# Patient Record
Sex: Female | Born: 1965 | Race: White | Hispanic: No | Marital: Married | Smoking: Never smoker
Health system: Southern US, Community
[De-identification: ages and names within clinical notes are randomized; demographics above are authoritative.]

## PROBLEM LIST (undated history)

## (undated) DIAGNOSIS — L409 Psoriasis, unspecified: Secondary | ICD-10-CM

## (undated) HISTORY — PX: WISDOM TOOTH EXTRACTION: SHX21

## (undated) HISTORY — PX: APPENDECTOMY: SHX54

## (undated) HISTORY — DX: Psoriasis, unspecified: L40.9

## (undated) HISTORY — PX: ADENOIDECTOMY: SHX5191

---

## 1999-10-27 ENCOUNTER — Other Ambulatory Visit: Admission: RE | Admit: 1999-10-27 | Discharge: 1999-10-27 | Payer: Self-pay | Admitting: Obstetrics and Gynecology

## 1999-11-14 LAB — HM DEXA SCAN

## 2000-12-14 ENCOUNTER — Other Ambulatory Visit: Admission: RE | Admit: 2000-12-14 | Discharge: 2000-12-14 | Payer: Self-pay | Admitting: Obstetrics and Gynecology

## 2002-02-24 ENCOUNTER — Other Ambulatory Visit: Admission: RE | Admit: 2002-02-24 | Discharge: 2002-02-24 | Payer: Self-pay | Admitting: Obstetrics and Gynecology

## 2003-04-26 ENCOUNTER — Other Ambulatory Visit: Admission: RE | Admit: 2003-04-26 | Discharge: 2003-04-26 | Payer: Self-pay | Admitting: Obstetrics and Gynecology

## 2004-01-07 ENCOUNTER — Emergency Department (HOSPITAL_COMMUNITY): Admission: EM | Admit: 2004-01-07 | Discharge: 2004-01-07 | Payer: Self-pay | Admitting: Emergency Medicine

## 2004-01-10 ENCOUNTER — Encounter (HOSPITAL_COMMUNITY): Admission: RE | Admit: 2004-01-10 | Discharge: 2004-02-04 | Payer: Self-pay | Admitting: Emergency Medicine

## 2004-07-14 ENCOUNTER — Other Ambulatory Visit: Admission: RE | Admit: 2004-07-14 | Discharge: 2004-07-14 | Payer: Self-pay | Admitting: Obstetrics and Gynecology

## 2005-03-21 ENCOUNTER — Inpatient Hospital Stay (HOSPITAL_COMMUNITY): Admission: AD | Admit: 2005-03-21 | Discharge: 2005-03-21 | Payer: Self-pay | Admitting: Obstetrics and Gynecology

## 2005-03-21 ENCOUNTER — Inpatient Hospital Stay (HOSPITAL_COMMUNITY): Admission: AD | Admit: 2005-03-21 | Discharge: 2005-03-23 | Payer: Self-pay | Admitting: Obstetrics and Gynecology

## 2005-03-24 ENCOUNTER — Encounter: Admission: RE | Admit: 2005-03-24 | Discharge: 2005-04-22 | Payer: Self-pay | Admitting: Obstetrics and Gynecology

## 2005-04-23 ENCOUNTER — Encounter: Admission: RE | Admit: 2005-04-23 | Discharge: 2005-05-23 | Payer: Self-pay | Admitting: Obstetrics and Gynecology

## 2005-04-29 ENCOUNTER — Other Ambulatory Visit: Admission: RE | Admit: 2005-04-29 | Discharge: 2005-04-29 | Payer: Self-pay | Admitting: Obstetrics and Gynecology

## 2005-05-24 ENCOUNTER — Encounter: Admission: RE | Admit: 2005-05-24 | Discharge: 2005-06-23 | Payer: Self-pay | Admitting: Obstetrics and Gynecology

## 2005-06-24 ENCOUNTER — Encounter: Admission: RE | Admit: 2005-06-24 | Discharge: 2005-07-21 | Payer: Self-pay | Admitting: Obstetrics and Gynecology

## 2005-07-22 ENCOUNTER — Encounter: Admission: RE | Admit: 2005-07-22 | Discharge: 2005-08-21 | Payer: Self-pay | Admitting: Obstetrics and Gynecology

## 2005-08-22 ENCOUNTER — Encounter: Admission: RE | Admit: 2005-08-22 | Discharge: 2005-09-20 | Payer: Self-pay | Admitting: Obstetrics and Gynecology

## 2005-09-21 ENCOUNTER — Encounter: Admission: RE | Admit: 2005-09-21 | Discharge: 2005-10-21 | Payer: Self-pay | Admitting: Obstetrics and Gynecology

## 2005-10-22 ENCOUNTER — Encounter: Admission: RE | Admit: 2005-10-22 | Discharge: 2005-11-20 | Payer: Self-pay | Admitting: Obstetrics and Gynecology

## 2005-11-21 ENCOUNTER — Encounter: Admission: RE | Admit: 2005-11-21 | Discharge: 2005-12-21 | Payer: Self-pay | Admitting: Obstetrics and Gynecology

## 2005-12-22 ENCOUNTER — Encounter: Admission: RE | Admit: 2005-12-22 | Discharge: 2006-01-21 | Payer: Self-pay | Admitting: Obstetrics and Gynecology

## 2006-01-22 ENCOUNTER — Encounter: Admission: RE | Admit: 2006-01-22 | Discharge: 2006-02-20 | Payer: Self-pay | Admitting: Obstetrics and Gynecology

## 2006-02-21 ENCOUNTER — Encounter: Admission: RE | Admit: 2006-02-21 | Discharge: 2006-03-23 | Payer: Self-pay | Admitting: Obstetrics and Gynecology

## 2006-03-24 ENCOUNTER — Encounter: Admission: RE | Admit: 2006-03-24 | Discharge: 2006-04-23 | Payer: Self-pay | Admitting: Obstetrics and Gynecology

## 2009-07-29 ENCOUNTER — Encounter: Admission: RE | Admit: 2009-07-29 | Discharge: 2009-07-29 | Payer: Self-pay | Admitting: Obstetrics and Gynecology

## 2009-12-12 ENCOUNTER — Encounter: Admission: RE | Admit: 2009-12-12 | Discharge: 2009-12-12 | Payer: Self-pay | Admitting: Obstetrics and Gynecology

## 2009-12-18 ENCOUNTER — Encounter: Admission: RE | Admit: 2009-12-18 | Discharge: 2009-12-18 | Payer: Self-pay | Admitting: Obstetrics and Gynecology

## 2010-06-08 ENCOUNTER — Encounter: Payer: Self-pay | Admitting: Obstetrics and Gynecology

## 2010-10-03 NOTE — Op Note (Signed)
NAME:  Martha Rhodes, Martha Rhodes               ACCOUNT NO.:  [id number]   MEDICAL RECORD NO.:  [id number]          PATIENT TYPE:  INP   LOCATION:  9167                          FACILITY:  [facility]   PHYSICIAN:  Juluis Mire, M.D.   DATE OF BIRTH:  August 20, 1965   DATE OF PROCEDURE:  03/21/2005  DATE OF DISCHARGE:                                 OPERATIVE REPORT   DELIVERY NOTE:  The patient presented with spontaneous onset of labor at 37-1/2 weeks.  With  the onset of second stage, infant started having repetitive deep variables.  Initially responded to some position changes, hydration and ephedrine.  However, as the vertex descended, the variables became more persistent and  deep.  The patient was set up in the dorsal lithotomy position and the  decision to proceed with a vacuum-assisted vaginal delivery.  The risks were  discussed including possibility of hematoma formation with need for  transfusion.  Rare risk of intracranial bleeds.  The Kiwi VE was put in  place.  The infant was in the OA presentation.  With the next contraction,  the vertex was delivered with the aid of the vacuum extractor with an easy  pull.  The infant was a viable female, Apgars 8 and 9.  PH pending.  There was  no episiotomy.  She had a left periurethral tear that was repaired and a  second-degree midline tear that was repaired.  The placenta was delivered  intact with three-vessel cord.  Estimated blood loss was 400 mL.  Mother and  baby were doing well in the postpartum period.      Juluis Mire, M.D.  Electronically Signed     JSM/MEDQ  D:  03/21/2005  T:  03/22/2005  Job:  [id number]

## 2014-11-09 ENCOUNTER — Encounter: Payer: Self-pay | Admitting: Obstetrics and Gynecology

## 2015-06-10 ENCOUNTER — Encounter: Payer: Self-pay | Admitting: Gastroenterology

## 2015-06-19 ENCOUNTER — Ambulatory Visit: Payer: BC Managed Care – PPO | Attending: Obstetrics and Gynecology

## 2015-06-19 DIAGNOSIS — M545 Low back pain, unspecified: Secondary | ICD-10-CM

## 2015-06-19 DIAGNOSIS — M25659 Stiffness of unspecified hip, not elsewhere classified: Secondary | ICD-10-CM | POA: Diagnosis present

## 2015-06-19 NOTE — Patient Instructions (Signed)
All stretches 20 second hold, 2-3 reps, 2-3 times a day   Piriformis Stretch - Supine    Pull uninvolved knee across body toward opposite shoulder. Hold slight stretch for ___ seconds. Repeat with involved leg. Repeat ___ times. Do ___ times per day.  Piriformis Stretch, Sitting    Sit, one ankle on opposite knee, same-side hand on crossed knee. Push down on knee, keeping spine straight. Lean torso forward, with flat back, until tension is felt in hamstrings and gluteals of crossed-leg side. Hold ___ seconds.  Repeat ___ times per session. Do ___ sessions per day.  Copyright  VHI. All rights reserved.  HIP: Hamstrings - Short Sitting    Rest leg on raised surface. Keep knee straight. Lift chest. Hold ___ seconds. ___ reps per set, ___ sets per day, ___ days per week  Copyright  VHI. All rights reserved.  Piriformis Stretch, Supine    Lie supine, folded towel under sacrum, one ankle crossed onto opposite knee. Holding bottom leg behind knee, gently pull legs toward chest and roll toward top-leg side. Feel stretch in hip or pelvic region. Hold ___ seconds.  Repeat ___ times per session. Do ___ sessions per day.  Copyright  VHI. All rights reserved.  [facility] [address], [address] [place], [place] [address] Phone # [phone] Fax [phone]

## 2015-06-19 NOTE — Therapy (Signed)
[facility] [address] [place], [place], [address] Phone: [phone]   Fax:  [phone]  Physical Therapy Evaluation  Patient Details  Name: Martha Rhodes MRN: [id number] Date of Birth: 1965-12-28 Referring Provider: Marcelle Overlie, MD  Encounter Date: 06/19/2015      PT End of Session - 06/19/15 1140    Visit Number 1   Date for PT Re-Evaluation 08/14/15   PT Start Time 1100   PT Stop Time 1142   PT Time Calculation (min) 42 min   Activity Tolerance Patient tolerated treatment well   Behavior During Therapy [facility] for tasks assessed/performed      History reviewed. No pertinent past medical history.  History reviewed. No pertinent past surgical history.  There were no vitals filed for this visit.  Visit Diagnosis:  Right-sided low back pain without sciatica - Plan: PT plan of care cert/re-cert  Hip stiffness, unspecified laterality - Plan: PT plan of care cert/re-cert      Subjective Assessment - 06/19/15 1105    Subjective Pt reports to PT with chronic history (3-4 years) of LBP/SI joint pain and Rt leg pain.  Pt reports that she was being treated at another facility that was out of network.  Treatment included core strength and felxiblity.  Pt has had to stop running due to "tripping" with this activity.     Pertinent History no injury   Limitations Sitting   How long can you sit comfortably? sits a lot with work- pain (5/10)   Diagnostic tests none   Patient Stated Goals improve core strength, reduce pain, not trip while walking   Currently in Pain? Yes   Pain Score 4    Pain Location Back   Pain Orientation Right;Lower   Pain Descriptors / Indicators Sore;Aching   Pain Type Chronic pain   Pain Radiating Towards Rt hip   Pain Onset More than a month ago   Pain Frequency Constant   Aggravating Factors  sitting for long periods, lifting   Pain Relieving Factors stretching, Advil at night,  pillow between the knees   Effect of Pain on Daily Activities limited ability to lift, too much pain to run            [facility] PT Assessment - 06/19/15 0001    Assessment   Medical Diagnosis back pain (M54.5), leg pain (R52), leg misalignment (M21.769)   Referring Provider Marcelle Overlie, MD   Onset Date/Surgical Date 06/18/12   Next MD Visit none   Prior Therapy PT in [place]: mainly massage and HEP for core strength and flexibility.    Precautions   Precautions None   Restrictions   Weight Bearing Restrictions No   Balance Screen   Has the patient fallen in the past 6 months No   Has the patient had a decrease in activity level because of a fear of falling?  No   Is the patient reluctant to leave their home because of a fear of falling?  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   Leisure exercise   Cognition   Overall Cognitive Status Within Functional Limits for tasks assessed   Observation/Other Assessments   Focus on Therapeutic Outcomes (FOTO)  48% limitation   Posture/Postural Control   Posture/Postural Control Postural limitations   Postural  Limitations Decreased lumbar lordosis  Rt leg ~1/2 inch longer-pt wears heel lift in the Lt shoe   ROM / Strength   AROM / PROM / Strength AROM;PROM;Strength   AROM   Overall AROM  Deficits   Overall AROM Comments lumbar AROM is WFLs with reduced segmental mobility in the lumbar spine with all lumbar AROM   PROM   Overall PROM  Deficits   Overall PROM Comments Lt hip hamstring limited by 25% vs the Rt, hip IR limited by 25% bilaterally   Strength   Overall Strength Within functional limits for tasks performed   Overall Strength Comments 5/5 bilateral LE strength except hip abduction 4+/5   Palpation   Spinal mobility reduced PA mobility in the lumbar  spine by 25% without pain   Palpation comment tension noted in Rt and Lt deep gluteals and lumbar paraspinals   Special Tests    Special Tests Lumbar   Lumbar Tests Slump Test;Straight Leg Raise   Slump test   Findings Negative   Side Right   Straight Leg Raise   Findings Negative   Side  Right   Ambulation/Gait   Ambulation/Gait Yes   Ambulation/Gait Assistance 7: Independent                           PT Education - 06/19/15 1138    Education provided Yes   Education Details HEP: hip and hamstring flexibility   Person(s) Educated Patient   Methods Explanation;Demonstration;Handout   Comprehension Verbalized understanding;Returned demonstration          PT Short Term Goals - 06/19/15 1145    PT SHORT TERM GOAL #1   Title be independent in iniital HEP   Time 4   Period Weeks   Status New   PT SHORT TERM GOAL #2   Title report a 30% reduction in LBP/Rt hip pain with sitting at work   Time 4   Period Weeks   Status New   PT SHORT TERM GOAL #3   Title verbalize and demonstrate correct body mechanics for lumbar protection and report use of mini breaks at work   Time 4   Period Weeks   Status New           PT Long Term Goals - 06/19/15 1100    PT LONG TERM GOAL #1   Title be independent in advanced HEP   Time 8   Period Weeks   Status New   PT LONG TERM GOAL #2   Title reduce FOTO to < or = to 36% limitation   Time 8   Period Weeks   Status New   PT LONG TERM GOAL #3   Title report a 60% reduction in LBP and Rt hip pain with sitting at work   Time 8   Period Weeks   Status New               Plan - 06/19/15 1142    Clinical Impression Statement Pt presents to PT with chronic history of LBP and Rt SI joint pain of a chronic nature.  Pt demonstrates hip stiffness Lt>Rt, Rt LE is longer, and pain with sitting long periods.  Pt with reduced lumbar segmental mobility in the lumbar spine.  Pt will benefit from PT for body mechanics  education, advancement of core stab/pilates exercises and flexibility of hips and lumbar spine.     Pt will benefit from skilled therapeutic intervention in  order to improve on the following deficits Decreased mobility;Impaired flexibility;Pain;Decreased activity tolerance;Hypomobility   Rehab Potential Good   PT Frequency 1x / week   PT Duration 8 weeks   PT Treatment/Interventions ADLs/Self Care Home Management;Cryotherapy;Electrical Stimulation;Moist Heat;Therapeutic exercise;Therapeutic activities;Functional mobility training;Ultrasound;Neuromuscular re-education;Manual techniques;Taping;Passive range of motion   PT Next Visit Plan core exercises, body mechanics, hip flexibility Lt>Rt, pain management as needed.   Consulted and Agree with Plan of Care Patient         Problem List There are no active problems to display for this patient.   Reshunda Strider, PT 06/19/2015, 12:40 PM  [facility] [address], [address] [place], [place], [address] Phone: [phone]   Fax:  [phone]  Name: TZIPORAH KNOKE MRN: [id number] Date of Birth: 10-06-1965

## 2015-07-03 ENCOUNTER — Encounter: Payer: Self-pay | Admitting: Physical Therapy

## 2015-07-03 ENCOUNTER — Ambulatory Visit: Payer: BC Managed Care – PPO | Admitting: Physical Therapy

## 2015-07-03 DIAGNOSIS — M25659 Stiffness of unspecified hip, not elsewhere classified: Secondary | ICD-10-CM

## 2015-07-03 DIAGNOSIS — M545 Low back pain, unspecified: Secondary | ICD-10-CM

## 2015-07-03 NOTE — Therapy (Signed)
[facility] [address], [address] [place], [place], [address] Phone: [phone]   Fax:  [phone]  Physical Therapy Treatment  Patient Details  Name: NOE PITTSLEY MRN: [id number] Date of Birth: 1966/04/26 Referring Provider: Marcelle Overlie, MD  Encounter Date: 07/03/2015      PT End of Session - 07/03/15 1155    Visit Number 2   Date for PT Re-Evaluation 08/14/15   PT Start Time 1148   PT Stop Time 1231   PT Time Calculation (min) 43 min   Activity Tolerance Patient tolerated treatment well   Behavior During Therapy [facility] for tasks assessed/performed      History reviewed. No pertinent past medical history.  History reviewed. No pertinent past surgical history.  There were no vitals filed for this visit.  Visit Diagnosis:  Right-sided low back pain without sciatica  Hip stiffness, unspecified laterality      Subjective Assessment - 07/03/15 1151    Subjective Doing her exercises at home 5 days week. Normal "chronic"  right sided pain today.    Currently in Pain? Yes   Pain Score 6    Pain Location Back   Pain Orientation Right;Lower   Pain Descriptors / Indicators Sore   Aggravating Factors  Sitting too long   Pain Relieving Factors Exercises, meds   Multiple Pain Sites No                         [facility] Adult PT Treatment/Exercise - 07/03/15 0001    Lumbar Exercises: Stretches   Active Hamstring Stretch 3 reps;20 seconds  Seated    Single Knee to Chest Stretch 1 rep;20 seconds   Piriformis Stretch 2 reps;20 seconds   Lumbar Exercises: Aerobic   Stationary Bike Nustep L3 x 10 min   Lumbar Exercises: Supine   Other Supine Lumbar Exercises Lumbar release with soft foam roll.                  PT Short Term Goals - 07/03/15 1216    PT SHORT TERM GOAL #1   Title be independent in iniital HEP   Time 4   Period Weeks   Status Achieved   PT SHORT TERM GOAL #2   Title  report a 30% reduction in LBP/Rt hip pain with sitting at work   Time 4   Period Weeks   Status On-going  Too early   PT SHORT TERM GOAL #3   Title verbalize and demonstrate correct body mechanics for lumbar protection and report use of mini breaks at work   Time 4   Period Weeks   Status Achieved           PT Long Term Goals - 06/19/15 1100    PT LONG TERM GOAL #1   Title be independent in advanced HEP   Time 8   Period Weeks   Status New   PT LONG TERM GOAL #2   Title reduce FOTO to < or = to 36% limitation   Time 8   Period Weeks   Status New   PT LONG TERM GOAL #3   Title report a 60% reduction in LBP and Rt hip pain with sitting at work   Time 8   Period Weeks   Status New               Plan - 07/03/15 1216    Clinical Impression Statement First treatment today. pt  compliant and independent in her HEP and taking short but frequent rest breaks throughout her day. Review of lumbar protective body mechanics today. Introduced release work today for the [employer] using the soft foam roll.    Pt will benefit from skilled therapeutic intervention in order to improve on the following deficits Decreased mobility;Impaired flexibility;Pain;Decreased activity tolerance;Hypomobility   Rehab Potential Good   PT Frequency 1x / week   PT Duration 8 weeks   PT Treatment/Interventions ADLs/Self Care Home Management;Cryotherapy;Electrical Stimulation;Moist Heat;Therapeutic exercise;Therapeutic activities;Functional mobility training;Ultrasound;Neuromuscular re-education;Manual techniques;Taping;Passive range of motion   PT Next Visit Plan See how pt felt with release work.    Consulted and Agree with Plan of Care Patient        Problem List There are no active problems to display for this patient.   Alejos Reinhardt, PTA 07/03/2015, 12:32 PM  [facility] [address] [place], [place], [address] Phone:  [phone]   Fax:  [phone]  Name: POLLIE POMA MRN: [id number] Date of Birth: 1966/01/19

## 2015-07-17 ENCOUNTER — Encounter: Payer: Self-pay | Admitting: Physical Therapy

## 2015-07-17 ENCOUNTER — Ambulatory Visit: Payer: BC Managed Care – PPO | Attending: Obstetrics and Gynecology | Admitting: Physical Therapy

## 2015-07-17 DIAGNOSIS — M545 Low back pain, unspecified: Secondary | ICD-10-CM

## 2015-07-17 DIAGNOSIS — M25659 Stiffness of unspecified hip, not elsewhere classified: Secondary | ICD-10-CM | POA: Insufficient documentation

## 2015-07-17 NOTE — Therapy (Signed)
[facility] [address], [address] [place], [place], [address] Phone: [phone]   Fax:  [phone]  Physical Therapy Treatment  Patient Details  Name: Martha Rhodes MRN: [id number] Date of Birth: Nov 27, 1965 Referring Provider: Marcelle Overlie, MD  Encounter Date: 07/17/2015      PT End of Session - 07/17/15 1107    Visit Number 3   Date for PT Re-Evaluation 08/14/15   PT Start Time 1105   PT Stop Time 1145   PT Time Calculation (min) 40 min   Activity Tolerance Patient tolerated treatment well   Behavior During Therapy [facility] for tasks assessed/performed      History reviewed. No pertinent past medical history.  History reviewed. No pertinent past surgical history.  There were no vitals filed for this visit.  Visit Diagnosis:  Right-sided low back pain without sciatica  Hip stiffness, unspecified laterality      Subjective Assessment - 07/17/15 1106    Subjective The foam roll technique really helped. They have them at the [facility].   Currently in Pain? No/denies   Multiple Pain Sites No                         [facility] Adult PT Treatment/Exercise - 07/17/15 0001    Lumbar Exercises: Aerobic   Stationary Bike Nustep L3 x 10 min                PT Education - 07/17/15 1109    Education provided Yes   Education Details HEP, Release method for lumbar   Person(s) Educated Patient   Methods Explanation;Demonstration;Tactile cues;Verbal cues;Handout   Comprehension Verbalized understanding;Returned demonstration          PT Short Term Goals - 07/17/15 1108    PT SHORT TERM GOAL #2   Title report a 30% reduction in LBP/Rt hip pain with sitting at work   Time 4   Period Weeks   Status On-going           PT Long Term Goals - 06/19/15 1100    PT LONG TERM GOAL #1   Title be independent in advanced HEP   Time 8   Period Weeks   Status New   PT LONG TERM GOAL #2   Title reduce  FOTO to < or = to 36% limitation   Time 8   Period Weeks   Status New   PT LONG TERM GOAL #3   Title report a 60% reduction in LBP and Rt hip pain with sitting at work   Time 8   Period Weeks   Status New               Plan - 07/17/15 1108    Pt will benefit from skilled therapeutic intervention in order to improve on the following deficits Decreased mobility;Impaired flexibility;Pain;Decreased activity tolerance;Hypomobility   Rehab Potential Good   PT Frequency 1x / week   PT Duration 8 weeks   PT Treatment/Interventions ADLs/Self Care Home Management;Cryotherapy;Electrical Stimulation;Moist Heat;Therapeutic exercise;Therapeutic activities;Functional mobility training;Ultrasound;Neuromuscular re-education;Manual techniques;Taping;Passive range of motion   PT Next Visit Plan Review foam roll exs   Consulted and Agree with Plan of Care Patient        Problem List There are no active problems to display for this patient.   Nahsir Venezia, PTA 07/17/2015, 11:42 AM  [facility] [address] [place], [place], [address] Phone: [phone]   Fax:  [phone]  Name: Martha Rhodes MRN: [id number] Date of Birth: 1965/10/14

## 2015-07-17 NOTE — Patient Instructions (Signed)
Soft foram roll release technique for lumbar. Assess first, lay flat and pay attention to what parts of your body touch the floor and which do not.   - Lay on the roll length wise, head to tailbone is ON the roll. Arms by your side, palms up. Breathe in, exhale and bring heaviness to the back of your body. Relax, let go.  Do for 1-2 min  - Scissor arms: alternating reaching the arm overhead with an inhale, exhale switch. Keep shoulder blades grounded on the roll. Do 10x each side-ish....Jamelle Haring angel arms: reach arms up together, overhead, then around back to the side of your body. Breathe! Do 10x ish....  - Slow, small rocking side to side. This should feel like you ALMOST fall off roll but you don't. It's like a massage to your spine. Breathe.    ASSESS: lay flat on floor and note changes to your body.   Change postions on roll. Lay flat with knees bent. Put roll under the bent knees, lift your buttocks up while you pull the roll onto your tailbone.    -Bring the knees up so they are slightly bent towards you, not quite parallel.  - SLOWLY rocking knees side to side. SMALL range of motion, breathe. 6x- 10x each side. - Circles; from the kneecaps, circle small circles 10x direction. Breathe You can do both directions.  - Circle with knees to the side: Staying with knees to the right, perform 6-10 SLOW circles, then repeat on the other side. - Straight leg stretch up in the air: Straighten the right leg up in the air with ankle flexed. Hold for 4 breaths, then circle ankle 5x each way.  Repeat with left leg. Do not force. - Put right foot down on mat and actively press into the mat as you pull the left knee to chest. Hold for 5 breaths. Repeat on other leg.   Then final rest assessment.   -

## 2015-07-19 ENCOUNTER — Ambulatory Visit (AMBULATORY_SURGERY_CENTER): Payer: Self-pay | Admitting: *Deleted

## 2015-07-19 VITALS — Ht 65.0 in | Wt 165.0 lb

## 2015-07-19 DIAGNOSIS — Z1211 Encounter for screening for malignant neoplasm of colon: Secondary | ICD-10-CM

## 2015-07-19 MED ORDER — SUPREP BOWEL PREP KIT 17.5-3.13-1.6 GM/177ML PO SOLN: 1.0000 | Freq: Once | ORAL | Status: DC

## 2015-07-19 NOTE — Progress Notes (Signed)
Patient denies any allergies to egg or soy products. Patient denies complications with anesthesia/sedation.  Patient denies oxygen use at home and denies diet medications. Emmi instructions for colonoscopy but denied.  

## 2015-07-29 ENCOUNTER — Ambulatory Visit: Payer: BC Managed Care – PPO | Admitting: Physical Therapy

## 2015-07-29 ENCOUNTER — Encounter: Payer: Self-pay | Admitting: Physical Therapy

## 2015-07-29 DIAGNOSIS — M25659 Stiffness of unspecified hip, not elsewhere classified: Secondary | ICD-10-CM

## 2015-07-29 DIAGNOSIS — M545 Low back pain, unspecified: Secondary | ICD-10-CM

## 2015-07-29 NOTE — Therapy (Addendum)
[facility] [address], [place], [place], [address] Phone: [phone]   Fax:  [phone]  Physical Therapy Treatment  Patient Details  Name: Martha Rhodes MRN: [id number] Date of Birth: Feb 26, 1966 Referring Provider: Dian Queen, MD  Encounter Date: 07/29/2015      PT End of Session - 07/29/15 1034    Visit Number 4   Date for PT Re-Evaluation 08/14/15   PT Start Time 1025  10 min late   PT Stop Time 1100   PT Time Calculation (min) 35 min   Activity Tolerance Patient tolerated treatment well   Behavior During Therapy [facility] for tasks assessed/performed      Past Medical History  Diagnosis Date  . Psoriasis     Past Surgical History  Procedure Laterality Date  . Appendectomy    . Wisdom tooth extraction      There were no vitals filed for this visit.  Visit Diagnosis:  Right-sided low back pain without sciatica  Hip stiffness, unspecified laterality      Subjective Assessment - 07/29/15 1030    Subjective I got a roll, tried at home, could not get off the floor without help bc of pain and a feeling of not being bale to hold myself up.    Currently in Pain? Yes   Pain Score 3    Pain Location --  RT side    Pain Orientation Right   Pain Descriptors / Indicators --  Uncomfortable all up & down the Rt side.   Aggravating Factors  Constant   Pain Relieving Factors Moving   Multiple Pain Sites No                         [facility] Adult PT Treatment/Exercise - 07/29/15 0001    Self-Care   Self-Care --  Review of self release techniques.   Lumbar Exercises: Stretches   Piriformis Stretch --  RT hip flexor & quad stretch 3x 20 sec   Lumbar Exercises: Aerobic   Stationary Bike Nustep L3 x 10 min   Lumbar Exercises: Prone   Other Prone Lumbar Exercises Proximal quad & hip flexor  release with foam roll   Manual Therapy   Manual Therapy --  Corrected pelvis; RT anterior to  LT,                   PT Short Term Goals - 07/17/15 1108    PT SHORT TERM GOAL #2   Title report a 30% reduction in LBP/Rt hip pain with sitting at work   Time 4   Period Weeks   Status On-going           PT Long Term Goals - 06/19/15 1100    PT LONG TERM GOAL #1   Title be independent in advanced HEP   Time 8   Period Weeks   Status New   PT LONG TERM GOAL #2   Title reduce FOTO to < or = to 36% limitation   Time 8   Period Weeks   Status New   PT LONG TERM GOAL #3   Title report a 60% reduction in LBP and Rt hip pain with sitting at work   Time 8   Period Weeks   Status New               Plan - 07/29/15 1147    Clinical Impression Statement Pelvis not aligned today most  likely causing her increased pain in her RT low back and SI area. We were able to correct almost perfect after release techniques of the anterior RT hip. She reported less pain at end of treatment.    Pt will benefit from skilled therapeutic intervention in order to improve on the following deficits Decreased mobility;Impaired flexibility;Pain;Decreased activity tolerance;Hypomobility   Rehab Potential Good   PT Frequency 1x / week   PT Duration 8 weeks   PT Treatment/Interventions ADLs/Self Care Home Management;Cryotherapy;Electrical Stimulation;Moist Heat;Therapeutic exercise;Therapeutic activities;Functional mobility training;Ultrasound;Neuromuscular re-education;Manual techniques;Taping;Passive range of motion   PT Next Visit Plan Review release techniques and see if pt was able to go back to the original series. Check pelvis.    Consulted and Agree with Plan of Care Patient        Problem List There are no active problems to display for this patient.   Martha Rhodes, PTA 07/29/2015, 11:50 AM PHYSICAL THERAPY DISCHARGE SUMMARY  Visits from Start of Care: 4  Current functional level related to goals / functional outcomes: See above for current status.  Pt didn't  return.     Remaining deficits: See above for remaining deficits.     Education / Equipment: HEP, Economist Plan: Patient agrees to discharge.  Patient goals were partially met. Patient is being discharged due to not returning since the last visit.  ?????   Martha Rhodes, PT 09/10/2015 12:14 PM  [facility] [address], [place], [place], [address] Phone: [phone]   Fax:  [phone]  Name: Martha Rhodes MRN: [id number] Date of Birth: [birth year]

## 2015-08-02 ENCOUNTER — Encounter: Payer: Self-pay | Admitting: Gastroenterology

## 2015-08-02 ENCOUNTER — Ambulatory Visit (AMBULATORY_SURGERY_CENTER): Payer: BC Managed Care – PPO | Admitting: Gastroenterology

## 2015-08-02 ENCOUNTER — Other Ambulatory Visit: Payer: Self-pay

## 2015-08-02 ENCOUNTER — Telehealth: Payer: Self-pay | Admitting: Gastroenterology

## 2015-08-02 VITALS — BP 110/60 | HR 57 | Temp 99.3°F | Resp 14 | Ht 65.0 in | Wt 165.0 lb

## 2015-08-02 DIAGNOSIS — Z1211 Encounter for screening for malignant neoplasm of colon: Secondary | ICD-10-CM

## 2015-08-02 DIAGNOSIS — R933 Abnormal findings on diagnostic imaging of other parts of digestive tract: Secondary | ICD-10-CM

## 2015-08-02 MED ORDER — SODIUM CHLORIDE 0.9 % IV SOLN: 500.0000 mL | INTRAVENOUS | Status: DC

## 2015-08-02 NOTE — Progress Notes (Signed)
No egg or soy allergy known to patient  No issues with past sedation with any surgeries  or procedures, no intubation problems  No diet pills per patient No home 02 use per patient  No blood thinners per patient  Pt denies issues with constipation   

## 2015-08-02 NOTE — Patient Instructions (Signed)
Normal colonoscopy.  Extrinisic mass--evaluate with CT or US, to be ordered by office  YOU HAD AN ENDOSCOPIC PROCEDURE TODAY AT THE [facility]:   Refer to the procedure report that was given to you for any specific questions about what was found during the examination.  If the procedure report does not answer your questions, please call your gastroenterologist to clarify.  If you requested that your care partner not be given the details of your procedure findings, then the procedure report has been included in a sealed envelope for you to review at your convenience later.  YOU SHOULD EXPECT: Some feelings of bloating in the abdomen. Passage of more gas than usual.  Walking can help get rid of the air that was put into your GI tract during the procedure and reduce the bloating. If you had a lower endoscopy (such as a colonoscopy or flexible sigmoidoscopy) you may notice spotting of blood in your stool or on the toilet paper. If you underwent a bowel prep for your procedure, you may not have a normal bowel movement for a few days.  Please Note:  You might notice some irritation and congestion in your nose or some drainage.  This is from the oxygen used during your procedure.  There is no need for concern and it should clear up in a day or so.  SYMPTOMS TO REPORT IMMEDIATELY:   Following lower endoscopy (colonoscopy or flexible sigmoidoscopy):  Excessive amounts of blood in the stool  Significant tenderness or worsening of abdominal pains  Swelling of the abdomen that is new, acute  Fever of 100F or higher  For urgent or emergent issues, a gastroenterologist can be reached at any hour by calling [phone].   DIET: Your first meal following the procedure should be a small meal and then it is ok to progress to your normal diet. Heavy or fried foods are harder to digest and may make you feel nauseous or bloated.  Likewise, meals heavy in dairy and vegetables can increase bloating.   Drink plenty of fluids but you should avoid alcoholic beverages for 24 hours.  ACTIVITY:  You should plan to take it easy for the rest of today and you should NOT DRIVE or use heavy machinery until tomorrow (because of the sedation medicines used during the test).    FOLLOW UP: Our staff will call the number listed on your records the next business day following your procedure to check on you and address any questions or concerns that you may have regarding the information given to you following your procedure. If we do not reach you, we will leave a message.  However, if you are feeling well and you are not experiencing any problems, there is no need to return our call.  We will assume that you have returned to your regular daily activities without incident.  If any biopsies were taken you will be contacted by phone or by letter within the next 1-3 weeks.  Please call us at [phone] if you have not heard about the biopsies in 3 weeks.    SIGNATURES/CONFIDENTIALITY: You and/or your care partner have signed paperwork which will be entered into your electronic medical record.  These signatures attest to the fact that that the information above on your After Visit Summary has been reviewed and is understood.  Full responsibility of the confidentiality of this discharge information lies with you and/or your care-partner.

## 2015-08-02 NOTE — Progress Notes (Signed)
A abd  O x 3 Report to [employer] RN

## 2015-08-02 NOTE — Op Note (Signed)
[facility] Patient Name: Martha Rhodes Procedure Date: 08/02/2015 11:20 AM MRN: [id number] Endoscopist: Napoleon Form , MD Age: 50 Referring MD:  Date of Birth: 06-08-1965 Gender: Female Procedure:                Colonoscopy Indications:              Screening for colorectal malignant neoplasm Medicines:                Monitored Anesthesia Care Procedure:                Pre-Anesthesia Assessment:                           - Prior to the procedure, a History and Physical                            was performed, and patient medications and                            allergies were reviewed. The patient's tolerance of                            previous anesthesia was also reviewed. The risks                            and benefits of the procedure and the sedation                            options and risks were discussed with the patient.                            All questions were answered, and informed consent                            was obtained. Prior Anticoagulants: The patient has                            taken no previous anticoagulant or antiplatelet                            agents. ASA Grade Assessment: I - A normal, healthy                            patient. After reviewing the risks and benefits,                            the patient was deemed in satisfactory condition to                            undergo the procedure.                           After obtaining informed consent, the colonoscope  was passed under direct vision. Throughout the                            procedure, the patient's blood pressure, pulse, and                            oxygen saturations were monitored continuously. The                            Model CF-HQ190L [id number]) scope was introduced                            through the anus and advanced to the the terminal                            ileum, with identification of the  appendiceal                            orifice and IC valve. The colonoscopy was performed                            without difficulty. The patient tolerated the                            procedure well. The quality of the bowel                            preparation was adequate. The terminal ileum,                            ileocecal valve, appendiceal orifice, and rectum                            were photographed. The quality of the bowel                            preparation was evaluated using the BBPS [facility]                            Bowel Preparation Scale) with scores of: Right                            Colon = 3 (entire mucosa seen well with no residual                            staining, small fragments of stool or opaque                            liquid), Transverse Colon = 2 (minor amount of                            residual staining, small fragments of stool and/or  opaque liquid, but mucosa seen well) and Left Colon                            = 2 (minor amount of residual staining, small                            fragments of stool and/or opaque liquid, but mucosa                            seen well). The total BBPS score equals 7. Scope In: 11:27:52 AM Scope Out: 11:41:46 AM Scope Withdrawal Time: 0 hours 8 minutes 42 seconds  Total Procedure Duration: 0 hours 13 minutes 54 seconds  Findings:      The perianal and digital rectal examinations were normal.      Subepitheial or ?extrinsic mass impression was noted in the proximal       ascending colon. Otherwise the entire examined colon appeared normal on       direct and retroflexion views. Complications:            No immediate complications. Estimated Blood Loss:     Estimated blood loss: none. Impression:               - The entire examined colon is normal on direct and                            retroflexion views. Possible subepitheial or                            ?extrinsic  mass impression was noted in the                            ascending colon                           - No specimens collected. Recommendation:           - Patient has a contact number available for                            emergencies. The signs and symptoms of potential                            delayed complications were discussed with the                            patient. Return to normal activities tomorrow.                            Written discharge instructions were provided to the                            patient.                           - Resume previous diet.                           -  Continue present medications.                           - Repeat colonoscopy in 10 years for screening                            purposes.                           -Obtain abdominal ultrasound or CT abd & pelvis                            with contrast to further evaluate the possible                            ?extrinsic mass causing impression on the proximal                            ascending colon                           - Return to primary care physician after studies                            are complete. Procedure Code(s):        --- Professional ---                           [id number], Colorectal cancer screening; colonoscopy on                            individual not meeting criteria for high risk CPT copyright 2016 American Medical Association. All rights reserved. Napoleon FormKavitha V. Melanee Cordial, MD 08/02/2015 11:48:12 AM This report has been signed electronically. Number of Addenda: 0

## 2015-08-02 NOTE — Telephone Encounter (Signed)
Patient called this AM. She has a colonoscopy scheduled for later this AM. She drank the prep last night and had several bowel movements. She vomited 8 oz of the prep this AM, but has the rest of it left. I asked her to drink the rest after taking a break and see if she tolerates it. She had thought after the first half of the prep her stool was relatively clear, thus it's still possible she will be ok to proceed today. I will let the staff know to call her for reassessment in an hour or so and to determine if she needs to be rescheduled.

## 2015-08-05 ENCOUNTER — Telehealth: Payer: Self-pay | Admitting: *Deleted

## 2015-08-05 NOTE — Telephone Encounter (Signed)
Message left

## 2015-08-08 ENCOUNTER — Telehealth: Payer: Self-pay | Admitting: Gastroenterology

## 2015-08-08 ENCOUNTER — Ambulatory Visit (HOSPITAL_COMMUNITY)
Admission: RE | Admit: 2015-08-08 | Discharge: 2015-08-08 | Disposition: A | Discharge status: Home / Self Care | Payer: BC Managed Care – PPO | Source: Ambulatory Visit | Attending: Gastroenterology | Admitting: Gastroenterology

## 2015-08-08 DIAGNOSIS — K824 Cholesterolosis of gallbladder: Secondary | ICD-10-CM | POA: Insufficient documentation

## 2015-08-08 DIAGNOSIS — R933 Abnormal findings on diagnostic imaging of other parts of digestive tract: Secondary | ICD-10-CM | POA: Diagnosis not present

## 2015-08-08 NOTE — Telephone Encounter (Signed)
I have left message for the patient to call back 

## 2015-08-09 NOTE — Telephone Encounter (Signed)
Her results have not been reviewed by her provider. Advised there are no tumors or masses mentioned in the report.

## 2015-08-20 ENCOUNTER — Telehealth: Payer: Self-pay | Admitting: Gastroenterology

## 2015-08-20 NOTE — Telephone Encounter (Signed)
I have left message for the patient to call back 

## 2015-08-20 NOTE — Telephone Encounter (Signed)
Discussed the u/s results and how the recall will work. Copy of her procedure and u/s faxed to Dr Thana AtesGrewel per her request.

## 2015-12-09 ENCOUNTER — Other Ambulatory Visit: Payer: Self-pay | Admitting: Physical Medicine and Rehabilitation

## 2015-12-09 DIAGNOSIS — M5416 Radiculopathy, lumbar region: Secondary | ICD-10-CM

## 2015-12-15 ENCOUNTER — Other Ambulatory Visit: Payer: BC Managed Care – PPO

## 2015-12-21 ENCOUNTER — Other Ambulatory Visit: Payer: Self-pay | Admitting: Obstetrics and Gynecology

## 2015-12-21 DIAGNOSIS — R1900 Intra-abdominal and pelvic swelling, mass and lump, unspecified site: Secondary | ICD-10-CM

## 2016-01-11 ENCOUNTER — Ambulatory Visit
Admission: RE | Admit: 2016-01-11 | Discharge: 2016-01-11 | Disposition: A | Discharge status: Home / Self Care | Payer: BC Managed Care – PPO | Source: Ambulatory Visit | Attending: Obstetrics and Gynecology | Admitting: Obstetrics and Gynecology

## 2016-01-11 DIAGNOSIS — R1900 Intra-abdominal and pelvic swelling, mass and lump, unspecified site: Secondary | ICD-10-CM

## 2016-01-11 MED ORDER — GADOBENATE DIMEGLUMINE 529 MG/ML IV SOLN
15.0000 mL | Freq: Once | INTRAVENOUS | Status: AC | PRN
  Administered 2016-01-11: 15 mL via INTRAVENOUS

## 2016-08-21 ENCOUNTER — Telehealth: Payer: Self-pay

## 2016-08-21 NOTE — Telephone Encounter (Signed)
-----   Message from Evalee Jefferson, LPN sent at 05/23/1094 11:59 AM EDT -----   ----- Message -----    From: Evalee Jefferson, LPN    Sent: 0/08/5407   4:50 PM      To: Evalee Jefferson, LPN  Abdominal u/s to re-evaluate the gall bladder polyp

## 2016-08-21 NOTE — Telephone Encounter (Signed)
Left a message to call back. She is due the follow up abdominal u/s.

## 2016-08-26 NOTE — Telephone Encounter (Signed)
Letter to the patient to call for her abdominal u/s scheduling.

## 2016-09-03 ENCOUNTER — Other Ambulatory Visit: Payer: Self-pay

## 2016-09-03 ENCOUNTER — Telehealth: Payer: Self-pay

## 2016-09-03 DIAGNOSIS — K824 Cholesterolosis of gallbladder: Secondary | ICD-10-CM

## 2016-09-03 NOTE — Telephone Encounter (Signed)
ok 

## 2016-09-03 NOTE — Telephone Encounter (Signed)
Dr Lavon Paganini, this patient is asking if you will release her to become a patient of Dr Christella Hartigan. She does not have complaints. She had intended to ask from the beginning. Her GYN recommended him originally.

## 2016-09-04 NOTE — Telephone Encounter (Signed)
Left her a voicemail advising her of this and to call and schedule the appointment soon for the next available.

## 2016-09-04 NOTE — Telephone Encounter (Signed)
Happy to see her.  Please scheduled her for my next available ROV.

## 2016-10-06 ENCOUNTER — Ambulatory Visit (HOSPITAL_COMMUNITY)
Admission: RE | Admit: 2016-10-06 | Discharge: 2016-10-06 | Disposition: A | Discharge status: Home / Self Care | Payer: BC Managed Care – PPO | Source: Ambulatory Visit | Attending: Gastroenterology | Admitting: Gastroenterology

## 2016-10-06 DIAGNOSIS — K824 Cholesterolosis of gallbladder: Secondary | ICD-10-CM | POA: Diagnosis present

## 2016-10-06 DIAGNOSIS — K760 Fatty (change of) liver, not elsewhere classified: Secondary | ICD-10-CM | POA: Diagnosis not present

## 2017-06-03 ENCOUNTER — Ambulatory Visit: Payer: BC Managed Care – PPO | Admitting: Psychology

## 2017-10-05 LAB — HM PAP SMEAR

## 2018-02-08 IMAGING — US US ABDOMEN LIMITED
1 series · 14 of 25 positions shown · non-contrast
Comparison: Ultrasound 08/08/2015.

CLINICAL DATA: Gallbladder polyp.  Follow-up exam.

EXAM:
US ABDOMEN LIMITED - RIGHT UPPER QUADRANT

[Series 1: us abdomen limited · 0.20mm/px · 14 of 63 slices shown]
[im 1/63]
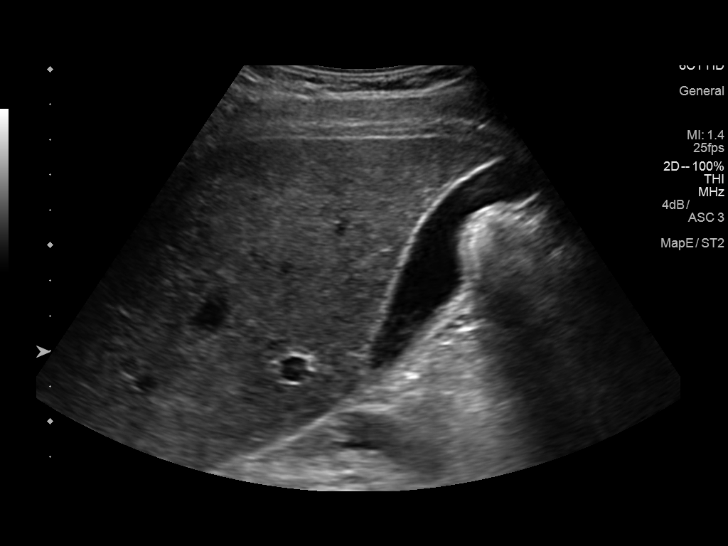
[im 6/63]
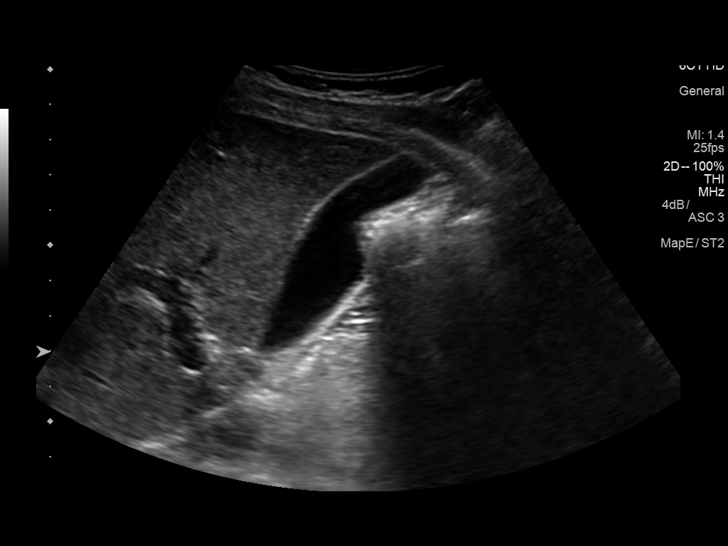
[im 11/63]
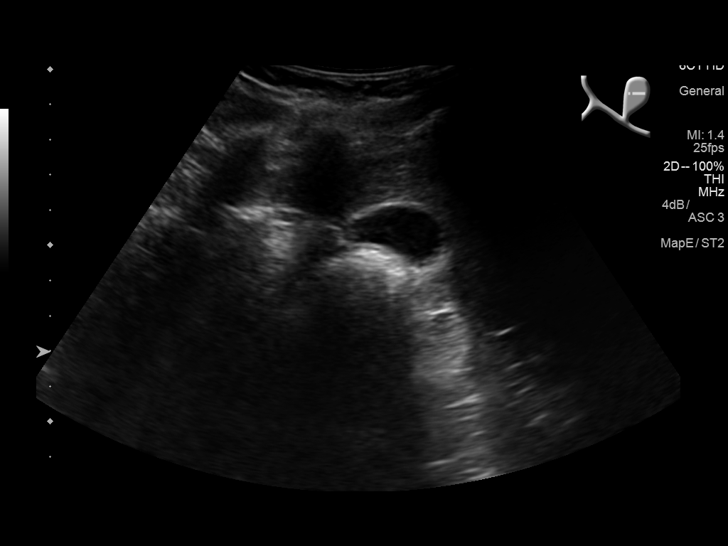
[im 16/63]
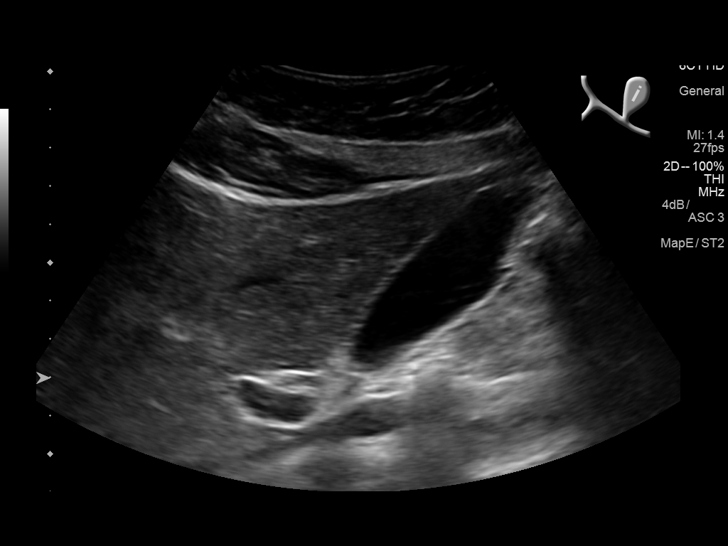
[im 21/63]
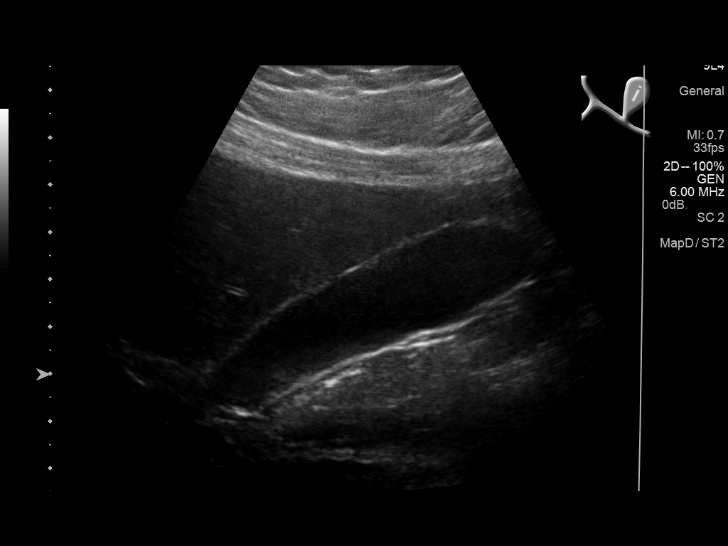
[im 24/63]
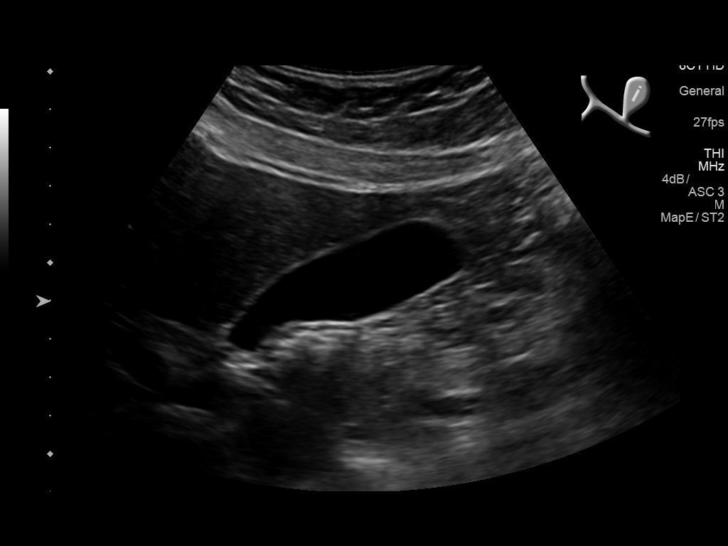
[im 29/63]
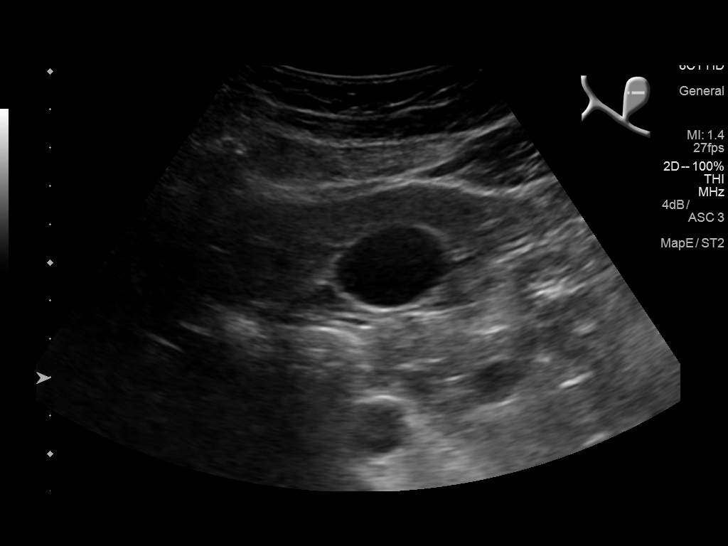
[im 34/63]
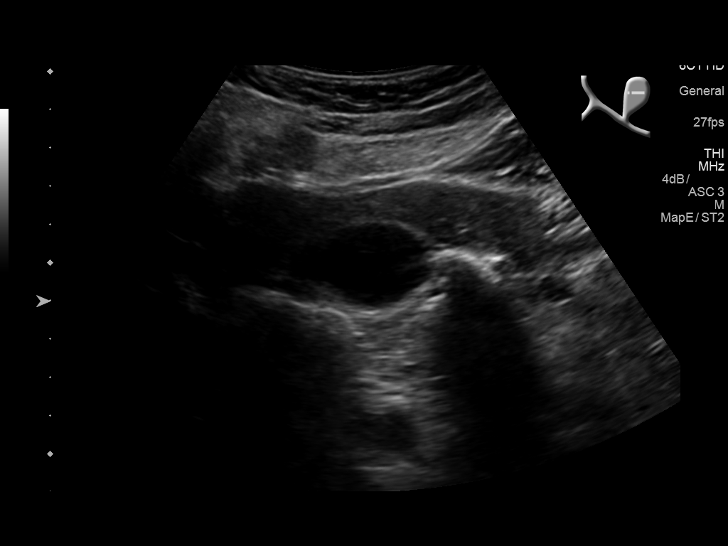
[im 39/63]
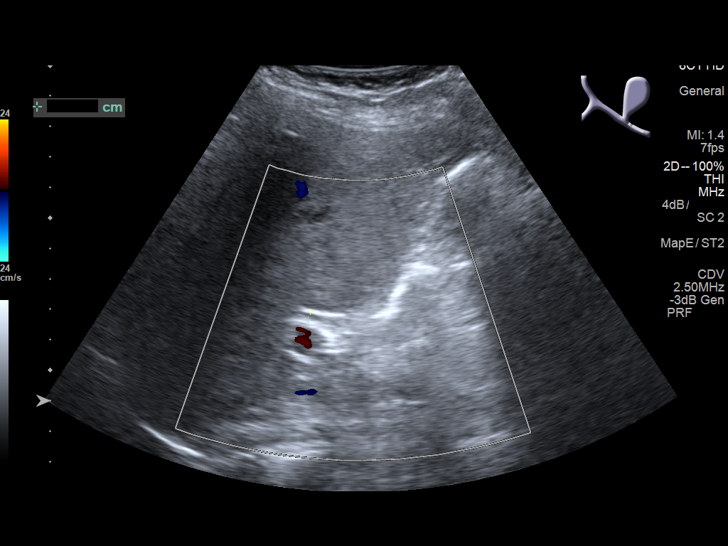
[im 42/63]
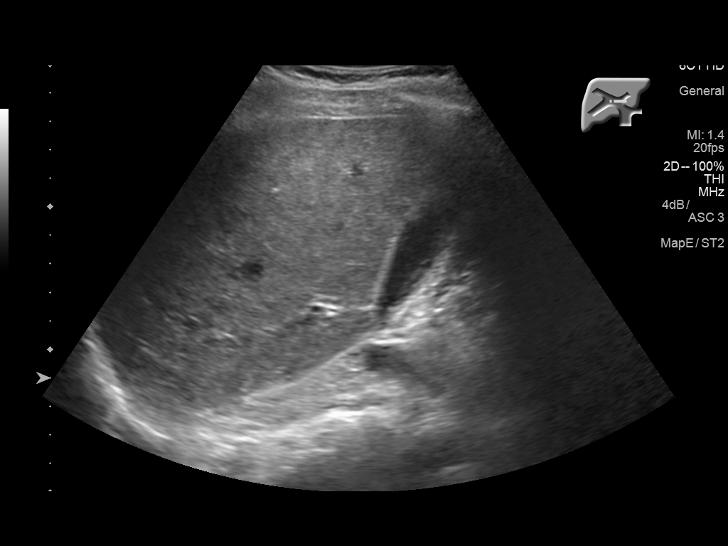
[im 47/63]
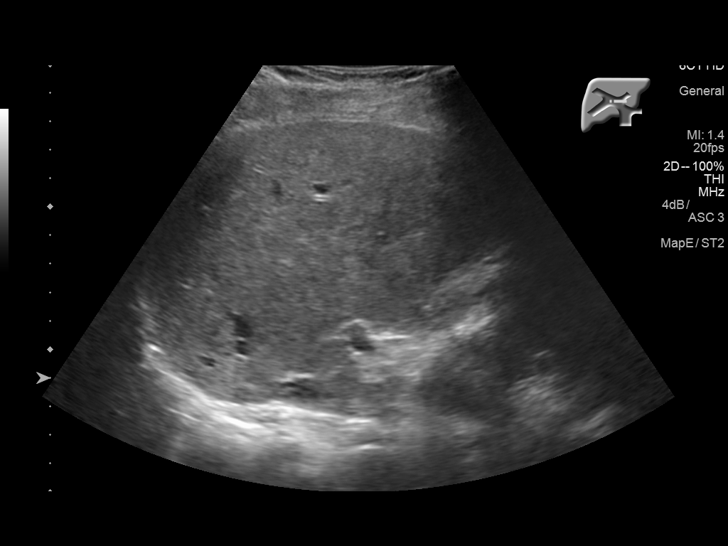
[im 52/63]
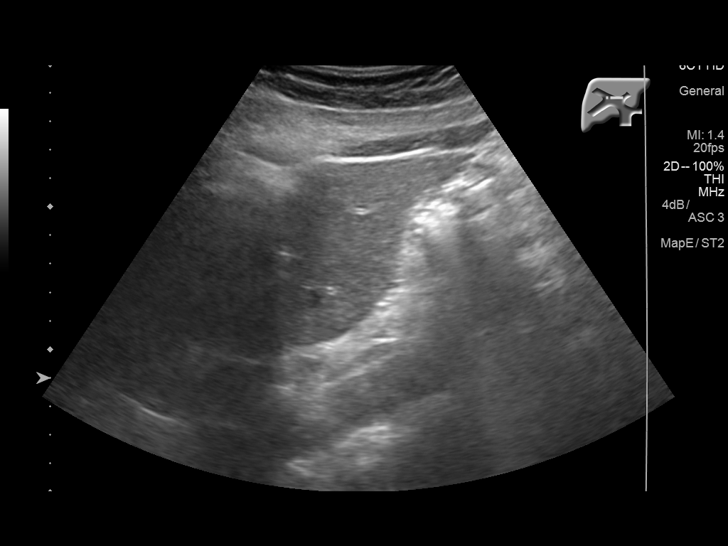
[im 57/63]
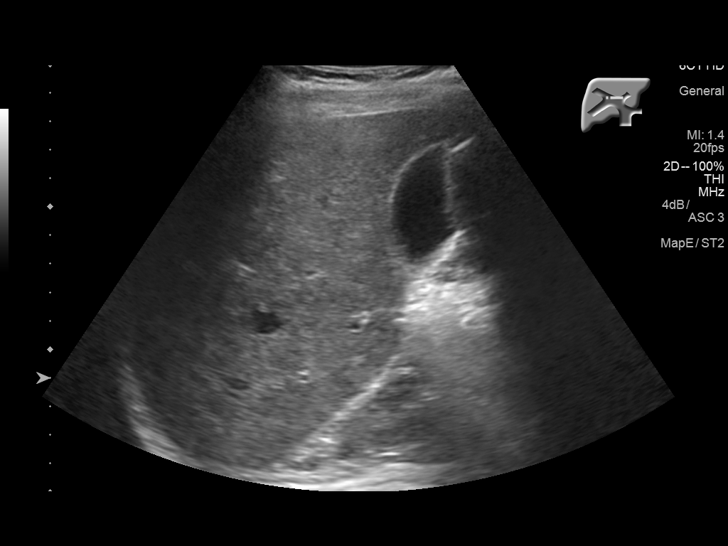
[im 63/63]
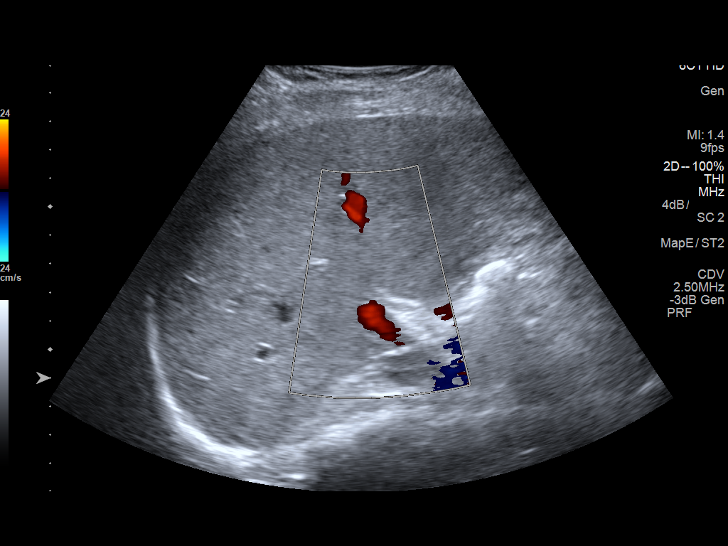

[14 of 25 positions shown; findings below may reference images not displayed]

FINDINGS: Gallbladder:

No gallstones. No gallbladder polyp noted on today's exam.
Gallbladder wall thickness normal. No pericholecystic fluid
collection. Negative Murphy sign.

Common bile duct:

Diameter: 2.7 mm

Liver:

No focal lesion identified. Increased echogenicity liver consistent
fatty infiltration and/or hepatocellular disease.
IMPRESSION: 1. No gallbladder polyp not on today's exam. No gallstones or
biliary distention. No acute abnormality identified.

2. Increased echogenicity liver consistent fatty infiltration and/or
hepatocellular disease.

## 2019-11-29 ENCOUNTER — Telehealth: Payer: Self-pay | Admitting: Family Medicine

## 2019-11-29 NOTE — Telephone Encounter (Signed)
Am happy to add her to the practice but it could be a bit before she gets an appt.

## 2019-11-29 NOTE — Telephone Encounter (Signed)
Patient referred by her OBGYN Doctor Marvel Plan, Patient is looking to establish care with you.   Please Advise

## 2020-02-29 LAB — HM MAMMOGRAPHY

## 2020-03-04 LAB — COMPREHENSIVE METABOLIC PANEL
Albumin: 4.7 (ref 3.5–5.0)
Calcium: 9.5 (ref 8.7–10.7)
GFR calc Af Amer: 86
GFR calc non Af Amer: 75
Globulin: 2.2

## 2020-03-04 LAB — LIPID PANEL
Cholesterol: 283 — AB (ref 0–200)
HDL: 85 — AB (ref 35–70)
LDL Cholesterol: 182
LDl/HDL Ratio: 2.1
Triglycerides: 97 (ref 40–160)

## 2020-03-04 LAB — HEPATIC FUNCTION PANEL
ALT: 19 (ref 7–35)
AST: 22 (ref 13–35)
Alkaline Phosphatase: 92 (ref 25–125)
Bilirubin, Total: 0.2

## 2020-03-04 LAB — CBC AND DIFFERENTIAL
HCT: 41 (ref 36–46)
Hemoglobin: 13.7 (ref 12.0–16.0)
Neutrophils Absolute: 3.6
Platelets: 260 (ref 150–399)
WBC: 5.5

## 2020-03-04 LAB — BASIC METABOLIC PANEL
BUN: 22 — AB (ref 4–21)
CO2: 22 (ref 13–22)
Chloride: 102 (ref 99–108)
Creatinine: 0.9 (ref <=1.1)
Glucose: 82
Potassium: 5 (ref 3.4–5.3)
Sodium: 141 (ref 137–147)

## 2020-03-04 LAB — TSH: TSH: 3.05 (ref 0.41–5.90)

## 2020-03-04 LAB — VITAMIN D 25 HYDROXY (VIT D DEFICIENCY, FRACTURES): Vit D, 25-Hydroxy: 25.8

## 2020-03-04 LAB — CBC: RBC: 4.4 (ref 3.87–5.11)

## 2020-03-04 LAB — HEMOGLOBIN A1C: Hemoglobin A1C: 5.4

## 2020-03-06 ENCOUNTER — Telehealth: Payer: Self-pay

## 2020-03-06 NOTE — Telephone Encounter (Signed)
NOTES ON FILE FROM Rogelia Boga MD [phone], SENT REFERRAL TO SCHEDULING

## 2020-05-28 ENCOUNTER — Ambulatory Visit: Payer: BC Managed Care – PPO | Admitting: Family Medicine

## 2020-05-29 ENCOUNTER — Other Ambulatory Visit: Payer: Self-pay

## 2020-06-03 ENCOUNTER — Telehealth: Payer: BC Managed Care – PPO | Admitting: Family Medicine

## 2020-06-19 ENCOUNTER — Ambulatory Visit: Payer: BC Managed Care – PPO | Admitting: Internal Medicine

## 2020-06-19 ENCOUNTER — Encounter: Payer: Self-pay | Admitting: Internal Medicine

## 2020-06-19 ENCOUNTER — Other Ambulatory Visit: Payer: Self-pay

## 2020-06-19 VITALS — BP 117/72 | HR 80 | Ht 65.0 in | Wt 179.0 lb

## 2020-06-19 DIAGNOSIS — E782 Mixed hyperlipidemia: Secondary | ICD-10-CM

## 2020-06-19 NOTE — Patient Instructions (Addendum)
Medication Instructions:  Your physician recommends that you continue on your current medications as directed. Please refer to the Current Medication list given to you today.  *If you need a refill on your cardiac medications before your next appointment, please call your pharmacy*   Lab Work: FASTING lab work - NMR lipoprofile, LP(a)  If you have labs (blood work) drawn today and your tests are completely normal, you will receive your results only by: Marland Kitchen MyChart Message (if you have MyChart) OR . A paper copy in the mail If you have any lab test that is abnormal or we need to change your treatment, we will call you to review the results.   Testing/Procedures: Dr. Rennis Golden has ordered a CT coronary calcium score. This test is done at [address]. This is $99 out of pocket.   Coronary CalciumScan A coronary calcium scan is an imaging test used to look for deposits of calcium and other fatty materials (plaques) in the inner lining of the blood vessels of the heart (coronary arteries). These deposits of calcium and plaques can partly clog and narrow the coronary arteries without producing any symptoms or warning signs. This puts a person at risk for a heart attack. This test can detect these deposits before symptoms develop. Tell a health care provider about:  Any allergies you have.  All medicines you are taking, including vitamins, herbs, eye drops, creams, and over-the-counter medicines.  Any problems you or family members have had with anesthetic medicines.  Any blood disorders you have.  Any surgeries you have had.  Any medical conditions you have.  Whether you are pregnant or may be pregnant. What are the risks? Generally, this is a safe procedure. However, problems may occur, including:  Harm to a pregnant woman and her unborn baby. This test involves the use of radiation. Radiation exposure can be dangerous to a pregnant woman and her unborn baby. If you  are pregnant, you generally should not have this procedure done.  Slight increase in the risk of cancer. This is because of the radiation involved in the test. What happens before the procedure? No preparation is needed for this procedure. What happens during the procedure?  You will undress and remove any jewelry around your neck or chest.  You will put on a hospital gown.  Sticky electrodes will be placed on your chest. The electrodes will be connected to an electrocardiogram (ECG) machine to record a tracing of the electrical activity of your heart.  A CT scanner will take pictures of your heart. During this time, you will be asked to lie still and hold your breath for 2-3 seconds while a picture of your heart is being taken. The procedure may vary among health care providers and hospitals. What happens after the procedure?  You can get dressed.  You can return to your normal activities.  It is up to you to get the results of your test. Ask your health care provider, or the department that is doing the test, when your results will be ready. Summary  A coronary calcium scan is an imaging test used to look for deposits of calcium and other fatty materials (plaques) in the inner lining of the blood vessels of the heart (coronary arteries).  Generally, this is a safe procedure. Tell your health care provider if you are pregnant or may be pregnant.  No preparation is needed for this procedure.  A CT scanner will take pictures of your heart.  You can return to your normal activities after the scan is done. This information is not intended to replace advice given to you by your health care provider. Make sure you discuss any questions you have with your health care provider. Document Released: 10/31/2007 Document Revised: 03/23/2016 Document Reviewed: 03/23/2016 Elsevier Interactive Patient Education  2017 [employer].     Follow-Up: At [facility], you and your health needs  are our priority.  As part of our continuing mission to provide you with exceptional heart care, we have created designated Provider Care Teams.  These Care Teams include your primary Cardiologist (physician) and Advanced Practice Providers (APPs -  Physician Assistants and Nurse Practitioners) who all work together to provide you with the care you need, when you need it.  We recommend signing up for the patient portal called "MyChart".  Sign up information is provided on this After Visit Summary.  MyChart is used to connect with patients for Virtual Visits (Telemedicine).  Patients are able to view lab/test results, encounter notes, upcoming appointments, etc.  Non-urgent messages can be sent to your provider as well.   To learn more about what you can do with MyChart, go to [email or web address].    Your next appointment:   4 month(s) - lipid clinic  The format for your next appointment:   In Person  Provider:   K. Italy Hilty, MD   Other Instructions: We will contact you when the genetic testing is available so you can come to the office for that

## 2020-06-19 NOTE — Progress Notes (Signed)
LIPID CLINIC CONSULT NOTE  Chief Complaint:  Dyslipidemia  Primary Care Physician: Marcelle Overlie, MD  Primary Cardiologist:  No primary care provider on file.  HPI:  Martha Rhodes is a 55 y.o. female who is being seen today for the evaluation of dyslipidemia at the request of Marcelle Overlie, MD. This is a very pleasant 55 year old female Martha Rhodes who presents today for evaluation management of dyslipidemia.  She is concerned due to recent findings of rising cholesterol.  She reports a longstanding history of elevated cholesterol even when she was much younger and much more active.  She still stays physically active and eats very healthy although recently has had some concern about weight gain.  Her labs in October showed total cholesterol 283 with HDL 85, LDL 182 and triglycerides 97.  She has no known coronary disease or prior cardiac events.  She has few other risk Rhodes, denying any diabetes, hypertension, tobacco abuse or any significant history of early onset heart disease.  She does take supplemental estrogen and progesterone at low doses.  PMHx:  Past Medical History:  Diagnosis Date  . Psoriasis     Past Surgical History:  Procedure Laterality Date  . APPENDECTOMY    . WISDOM TOOTH EXTRACTION      FAMHx:  Family History  Problem Relation Age of Onset  . Colon polyps Mother 80  . Colon cancer Maternal Aunt        60s  . High blood pressure Father   . Hyperlipidemia Father   . Heart attack Maternal Aunt   . Esophageal cancer Neg Hx   . Rectal cancer Neg Hx   . Stomach cancer Neg Hx   . Heart disease Neg Hx     SOCHx:   reports that she has never smoked. She has never used smokeless tobacco. She reports current alcohol use of about 2.0 standard drinks of alcohol per week. She reports that she does not use drugs.  ALLERGIES:  No Known Allergies  ROS: Pertinent items noted in HPI and remainder of comprehensive ROS otherwise  negative.  HOME MEDS: Current Outpatient Medications on File Prior to Visit  Medication Sig Dispense Refill  . Cholecalciferol (VITAMIN D3) 50 MCG (2000 UT) CAPS Take by mouth.    . estradiol (VIVELLE-DOT) 0.1 MG/24HR patch Place 1 patch onto the skin 2 (two) times a week.    . Omega-3 Fatty Acids (FISH OIL) 1000 MG CAPS Fish Oil    . OVER THE COUNTER MEDICATION corticare b cap 2 tablets in the morning and 2 tablets in the eveneing    . progesterone (ENDOMETRIN) 100 MG vaginal insert Place 100 mg vaginally 2 (two) times daily.    . progesterone (PROMETRIUM) 100 MG capsule Take 100 mg by mouth daily. Daily x 14 days then off 14 days    . temazepam (RESTORIL) 7.5 MG capsule Take 7.5 mg by mouth at bedtime.     No current facility-administered medications on file prior to visit.    LABS/IMAGING: No results found for this or any previous visit (from the past 48 hour(s)). No results found.  LIPID PANEL: No results found for: CHOL, TRIG, HDL, CHOLHDL, VLDL, LDLCALC, LDLDIRECT  WEIGHTS: Wt Readings from Last 3 Encounters:  06/19/20 179 lb (81.2 kg)  08/02/15 165 lb (74.8 kg)  07/19/15 165 lb (74.8 kg)    VITALS: BP 117/72   Pulse 80   Ht 5\' 5"  (1.651 m)   Wt 179 lb (  81.2 kg)   SpO2 97%   BMI 29.79 kg/m   EXAM: General appearance: alert and no distress Neck: no carotid bruit, no JVD and thyroid not enlarged, symmetric, no tenderness/mass/nodules Lungs: clear to auscultation bilaterally Heart: regular rate and rhythm Abdomen: soft, non-tender; bowel sounds normal; no masses,  no organomegaly Extremities: extremities normal, atraumatic, no cyanosis or edema Pulses: 2+ and symmetric Skin: Skin color, texture, turgor normal. No rashes or lesions Neurologic: Grossly normal Psych: Present  *Examination chaperoned by Julaine Fusi, RN.  EKG: Deferred  ASSESSMENT: 1. Mixed dyslipidemia with high HDL, triglycerides and LDL  PLAN: 1.   Martha Rhodes has a mixed dyslipidemia with  elevations in HDL triglycerides and LDL.  There does not seem to be early onset heart disease or signs in the family concerning for familial hyperlipidemia however this is suspicious for possibly a severe combined hyperlipidemia or familial combined hyperlipidemia.  I would recommend further risk ratification with a lipoprotein particle testing and we will also get a coronary calcium score to evaluate for any early onset cardiovascular disease.  Based on this likely the recommendation would be for high potency statin therapy, however we can further tailor her targets based on these findings.  She may also be appropriate for genetic testing.  We will discuss this further at return.  Thanks again for this interesting referral.  Chrystie Nose, MD, [facility]  [place]  [facility]  Medical Director of the [facility] Lipid [facility] &  Cardiovascular Risk Reduction Clinic Diplomate of the American Board of Clinical Lipidology Attending Cardiologist  Direct Dial: [phone]  Fax: [phone]  Website:  [email or web address] 06/19/2020, 2:15 PM

## 2020-06-22 LAB — LIPOPROTEIN A (LPA): Lipoprotein (a): 8.8 nmol/L (ref <75.0)

## 2020-06-22 LAB — NMR, LIPOPROFILE
Cholesterol, Total: 278 mg/dL — ABNORMAL HIGH (ref 100–199)
HDL Particle Number: 35.4 umol/L (ref >=30.5)
HDL-C: 87 mg/dL (ref >39)
LDL Particle Number: 1570 nmol/L — ABNORMAL HIGH (ref <1000)
LDL Size: 22 nm (ref >20.5)
LDL-C (NIH Calc): 178 mg/dL — ABNORMAL HIGH (ref 0–99)
LP-IR Score: <25 (ref <=45)
Small LDL Particle Number: <90 nmol/L (ref <=527)
Triglycerides: 81 mg/dL (ref 0–149)

## 2020-07-03 ENCOUNTER — Ambulatory Visit (INDEPENDENT_AMBULATORY_CARE_PROVIDER_SITE_OTHER)
Admission: RE | Admit: 2020-07-03 | Discharge: 2020-07-03 | Disposition: A | Discharge status: Home / Self Care | Payer: Self-pay | Source: Ambulatory Visit | Attending: Internal Medicine | Admitting: Internal Medicine

## 2020-07-03 ENCOUNTER — Other Ambulatory Visit: Payer: Self-pay

## 2020-07-03 DIAGNOSIS — E782 Mixed hyperlipidemia: Secondary | ICD-10-CM

## 2020-07-23 ENCOUNTER — Ambulatory Visit: Payer: BC Managed Care – PPO | Admitting: *Deleted

## 2020-07-23 ENCOUNTER — Other Ambulatory Visit: Payer: Self-pay

## 2020-07-23 DIAGNOSIS — E782 Mixed hyperlipidemia: Secondary | ICD-10-CM

## 2020-07-23 NOTE — Progress Notes (Signed)
1.) Reason for visit: genetic test  2.) Name of MD requesting visit: Dr. Zoila Shutter  3.) H&P: dyslipidemia  4.) ROS related to problem: genetic test per Dr. Rennis Golden to formulate plan of care for lipid management   5.) Assessment and plan per MD: n/a

## 2020-10-11 DIAGNOSIS — E782 Mixed hyperlipidemia: Secondary | ICD-10-CM

## 2020-10-16 ENCOUNTER — Ambulatory Visit: Payer: BC Managed Care – PPO | Admitting: Internal Medicine

## 2020-10-31 ENCOUNTER — Encounter: Payer: Self-pay | Admitting: Family Medicine

## 2020-10-31 ENCOUNTER — Other Ambulatory Visit: Payer: Self-pay

## 2020-10-31 ENCOUNTER — Ambulatory Visit: Payer: BC Managed Care – PPO | Admitting: Family Medicine

## 2020-10-31 DIAGNOSIS — E785 Hyperlipidemia, unspecified: Secondary | ICD-10-CM

## 2020-10-31 DIAGNOSIS — G4700 Insomnia, unspecified: Secondary | ICD-10-CM

## 2020-10-31 DIAGNOSIS — L409 Psoriasis, unspecified: Secondary | ICD-10-CM | POA: Insufficient documentation

## 2020-10-31 DIAGNOSIS — E663 Overweight: Secondary | ICD-10-CM

## 2020-10-31 HISTORY — DX: Hyperlipidemia, unspecified: E78.5

## 2020-10-31 HISTORY — DX: Insomnia, unspecified: G47.00

## 2020-10-31 NOTE — Assessment & Plan Note (Signed)
Encouraged DASH or MIND diet, decrease po intake and increase exercise as tolerated. Needs 7-8 hours of sleep nightly. Avoid trans fats, eat small, frequent meals every 4-5 hours with lean proteins, complex carbs and healthy fats. Minimize simple carbs, high fat foods and processed foods 

## 2020-10-31 NOTE — Assessment & Plan Note (Addendum)
Encourage heart healthy diet such as MIND or DASH diet, increase exercise, avoid trans fats, simple carbohydrates and processed foods, consider a krill or fish or flaxseed oil cap daily. Has been evaluated by Dr Rennis Golden of cardiology and for now without further risk factors cardiology is not recommending medical treatment.

## 2020-10-31 NOTE — Assessment & Plan Note (Signed)
Small patch on left eyebrow no need for meds at this time

## 2020-10-31 NOTE — Progress Notes (Signed)
Patient ID: Martha Rhodes, female    DOB: 18-Oct-1965  Age: 55 y.o. MRN: [id number]    Subjective:  Subjective  HPI Martha Rhodes is present for office visit today for concerns regarding her cholesterol. She reports that she is doing ok and was only worried about her cholesterol. She denies any chest pain, SOB, fever, abdominal pain, cough, chills, sore throat, dysuria, urinary incontinence, back pain, HA, or N/VD. She reports that she used to run everyday for exercise even when she was pregnant, but she states that she had to stop a while ago due to aging and hip pain. She states that at the moment she walk on campus at [facility] for 2.5 miles for exercise. She reports that she has had a shingles infection last year that was local to her left flank and states that she got the vaccination for it.   Review of Systems  Constitutional:  Negative for chills, fatigue and fever.  HENT:  Negative for congestion, rhinorrhea, sinus pressure, sinus pain and sore throat.   Eyes:  Negative for pain.  Respiratory:  Negative for cough and shortness of breath.   Cardiovascular:  Negative for chest pain, palpitations and leg swelling.  Gastrointestinal:  Negative for abdominal pain, blood in stool, diarrhea, nausea and vomiting.  Genitourinary:  Negative for decreased urine volume, flank pain, frequency, vaginal bleeding and vaginal discharge.  Musculoskeletal:  Negative for back pain.  Neurological:  Negative for headaches.   History Past Medical History:  Diagnosis Date   Dyslipidemia 10/31/2020   Insomnia 10/31/2020   Psoriasis     She has a past surgical history that includes Appendectomy and Wisdom tooth extraction.   Her family history includes Colon cancer in her maternal aunt; Colon polyps (age of onset: 86) in her mother; Heart attack in her maternal aunt; High blood pressure in her father; Hyperlipidemia in her father.She reports that she has never smoked. She has never used smokeless tobacco. She  reports current alcohol use of about 2.0 standard drinks of alcohol per week. She reports that she does not use drugs.  Current Outpatient Medications on File Prior to Visit  Medication Sig Dispense Refill   Cholecalciferol (VITAMIN D3) 50 MCG (2000 UT) CAPS Take by mouth.     estradiol (VIVELLE-DOT) 0.1 MG/24HR patch Place 1 patch onto the skin 2 (two) times a week.     Omega-3 Fatty Acids (FISH OIL) 1000 MG CAPS Fish Oil     OVER THE COUNTER MEDICATION corticare b cap 2 tablets in the morning and 2 tablets in the eveneing     progesterone (ENDOMETRIN) 100 MG vaginal insert Place 100 mg vaginally 2 (two) times daily.     progesterone (PROMETRIUM) 100 MG capsule Take 100 mg by mouth daily. Daily x 14 days then off 14 days     temazepam (RESTORIL) 7.5 MG capsule Take 7.5 mg by mouth at bedtime.     No current facility-administered medications on file prior to visit.     Objective:  Objective  Physical Exam Constitutional:      General: She is not in acute distress.    Appearance: Normal appearance. She is not ill-appearing or toxic-appearing.  HENT:     Head: Normocephalic and atraumatic.     Right Ear: Tympanic membrane, ear canal and external ear normal.     Left Ear: Tympanic membrane, ear canal and external ear normal.     Nose: No congestion or rhinorrhea.  Eyes:  Extraocular Movements: Extraocular movements intact.     Right eye: No nystagmus.     Left eye: No nystagmus.     Pupils: Pupils are equal, round, and reactive to light.  Cardiovascular:     Rate and Rhythm: Normal rate and regular rhythm.     Pulses: Normal pulses.     Heart sounds: Normal heart sounds. No murmur heard. Pulmonary:     Effort: Pulmonary effort is normal. No respiratory distress.     Breath sounds: Normal breath sounds. No wheezing, rhonchi or rales.  Abdominal:     General: Bowel sounds are normal.     Palpations: Abdomen is soft. There is no mass.     Tenderness: There is no abdominal  tenderness. There is no guarding.     Hernia: No hernia is present.  Musculoskeletal:        General: Normal range of motion.     Cervical back: Normal range of motion and neck supple.  Skin:    General: Skin is warm and dry.  Neurological:     Mental Status: She is alert and oriented to person, place, and time.     Deep Tendon Reflexes:     Reflex Scores:      Patellar reflexes are 2+ on the right side and 2+ on the left side. Psychiatric:        Behavior: Behavior normal.   There were no vitals taken for this visit. Wt Readings from Last 3 Encounters:  06/19/20 179 lb (81.2 kg)  08/02/15 165 lb (74.8 kg)  07/19/15 165 lb (74.8 kg)     No results found for: WBC, HGB, HCT, PLT, GLUCOSE, CHOL, TRIG, HDL, LDLDIRECT, LDLCALC, ALT, AST, NA, K, CL, CREATININE, BUN, CO2, TSH, PSA, INR, GLUF, HGBA1C, MICROALBUR  CT CARDIAC SCORING (SELF PAY ONLY)  Addendum Date: 07/03/2020   ADDENDUM REPORT: 07/03/2020 16:22 CLINICAL DATA:  Risk stratification EXAM: Coronary Calcium Score TECHNIQUE: The patient was scanned on a [employer] scanner. Axial non-contrast 3 mm slices were carried out through the heart. The data set was analyzed on a dedicated work station and scored using the Agatson method. FINDINGS: Non-cardiac: See separate report from [facility]. Ascending Aorta: Normal caliber Pericardium: Normal Coronary arteries: Normal origins IMPRESSION: Coronary calcium score of 0. This is a low risk study. Electronically Signed   By: Chrystie Nose M.D.   On: 07/03/2020 16:22   Result Date: 07/03/2020 EXAM: OVER-READ INTERPRETATION  CT CHEST The following report is an over-read performed by radiologist Dr. Jeronimo Greaves of [facility] on 07/03/2020. This over-read does not include interpretation of cardiac or coronary anatomy or pathology. The calcium score interpretation by the cardiologist is attached. COMPARISON:  None. FINDINGS: Vascular: Normal aortic caliber.  Mediastinum/Nodes: No imaged thoracic adenopathy. Lungs/Pleura: No pleural fluid. Left lower lobe calcified granuloma. Upper Abdomen: Normal imaged portions of the liver, spleen, stomach. Musculoskeletal: No acute osseous abnormality. IMPRESSION: No acute findings in the imaged extracardiac chest. Electronically Signed: By: Jeronimo Greaves M.D. On: 07/03/2020 15:42     Assessment & Plan:  Plan    No orders of the defined types were placed in this encounter.   Problem List Items Addressed This Visit       Other   Insomnia    Encouraged good sleep hygiene such as dark, quiet room. No blue/green glowing lights such as computer screens in bedroom. No alcohol or stimulants in evening. Cut down on caffeine as able. Regular exercise is helpful  but not just prior to bed time.        Dyslipidemia    Encourage heart healthy diet such as MIND or DASH diet, increase exercise, avoid trans fats, simple carbohydrates and processed foods, consider a krill or fish or flaxseed oil cap daily.         Follow-up: No follow-ups on file.   I,David Hanna,acting as a scribe for Danise Edge, MD.,have documented all relevant documentation on the behalf of Danise Edge, MD,as directed by  Danise Edge, MD while in the presence of Danise Edge, MD.  I, Bradd Canary, MD personally performed the services described in this documentation. All medical record entries made by the scribe were at my direction and in my presence. I have reviewed the chart and agree that the record reflects my personal performance and is accurate and complete

## 2020-10-31 NOTE — Patient Instructions (Addendum)
MIND diet   Shingrix is the new shingles shot, 2 shots over 2-6 months, confirm coverage with insurance and document, then can return here for shots with nurse appt or at pharmacy. Consider 4 th booster for covid in oct or nov  Preventive Care 38-57 Years Old, Female Preventive care refers to lifestyle choices and visits with your health care provider that can promote health and wellness. This includes: A yearly physical exam. This is also called an annual wellness visit. Regular dental and eye exams. Immunizations. Screening for certain conditions. Healthy lifestyle choices, such as: Eating a healthy diet. Getting regular exercise. Not using drugs or products that contain nicotine and tobacco. Limiting alcohol use. What can I expect for my preventive care visit? Physical exam Your health care provider will check your: Height and weight. These may be used to calculate your BMI (body mass index). BMI is a measurement that tells if you are at a healthy weight. Heart rate and blood pressure. Body temperature. Skin for abnormal spots. Counseling Your health care provider may ask you questions about your: Past medical problems. Family's medical history. Alcohol, tobacco, and drug use. Emotional well-being. Home life and relationship well-being. Sexual activity. Diet, exercise, and sleep habits. Work and work Statistician. Access to firearms. Method of birth control. Menstrual cycle. Pregnancy history. What immunizations do I need?  Vaccines are usually given at various ages, according to a schedule. Your health care provider will recommend vaccines for you based on your age, medicalhistory, and lifestyle or other factors, such as travel or where you work. What tests do I need? Blood tests Lipid and cholesterol levels. These may be checked every 5 years, or more often if you are over 88 years old. Hepatitis C test. Hepatitis B test. Screening Lung cancer screening. You may have  this screening every year starting at age 38 if you have a 30-pack-year history of smoking and currently smoke or have quit within the past 15 years. Colorectal cancer screening. All adults should have this screening starting at age 41 and continuing until age 54. Your health care provider may recommend screening at age 40 if you are at increased risk. You will have tests every 1-10 years, depending on your results and the type of screening test. Diabetes screening. This is done by checking your blood sugar (glucose) after you have not eaten for a while (fasting). You may have this done every 1-3 years. Mammogram. This may be done every 1-2 years. Talk with your health care provider about when you should start having regular mammograms. This may depend on whether you have a family history of breast cancer. BRCA-related cancer screening. This may be done if you have a family history of breast, ovarian, tubal, or peritoneal cancers. Pelvic exam and Pap test. This may be done every 3 years starting at age 53. Starting at age 61, this may be done every 5 years if you have a Pap test in combination with an HPV test. Other tests STD (sexually transmitted disease) testing, if you are at risk. Bone density scan. This is done to screen for osteoporosis. You may have this scan if you are at high risk for osteoporosis. Talk with your health care provider about your test results, treatment options,and if necessary, the need for more tests. Follow these instructions at home: Eating and drinking  Eat a diet that includes fresh fruits and vegetables, whole grains, lean protein, and low-fat dairy products. Take vitamin and mineral supplements as recommended by your health  care provider. Do not drink alcohol if: Your health care provider tells you not to drink. You are pregnant, may be pregnant, or are planning to become pregnant. If you drink alcohol: Limit how much you have to 0-1 drink a day. Be aware  of how much alcohol is in your drink. In the [place] one drink equals one 12 oz bottle of beer (355 mL), one 5 oz glass of wine (148 mL), or one 1 oz glass of hard liquor (44 mL).  Lifestyle Take daily care of your teeth and gums. Brush your teeth every morning and night with fluoride toothpaste. Floss one time each day. Stay active. Exercise for at least 30 minutes 5 or more days each week. Do not use any products that contain nicotine or tobacco, such as cigarettes, e-cigarettes, and chewing tobacco. If you need help quitting, ask your health care provider. Do not use drugs. If you are sexually active, practice safe sex. Use a condom or other form of protection to prevent STIs (sexually transmitted infections). If you do not wish to become pregnant, use a form of birth control. If you plan to become pregnant, see your health care provider for a prepregnancy visit. If told by your health care provider, take low-dose aspirin daily starting at age 54. Find healthy ways to cope with stress, such as: Meditation, yoga, or listening to music. Journaling. Talking to a trusted person. Spending time with friends and family. Safety Always wear your seat belt while driving or riding in a vehicle. Do not drive: If you have been drinking alcohol. Do not ride with someone who has been drinking. When you are tired or distracted. While texting. Wear a helmet and other protective equipment during sports activities. If you have firearms in your house, make sure you follow all gun safety procedures. What's next? Visit your health care provider once a year for an annual wellness visit. Ask your health care provider how often you should have your eyes and teeth checked. Stay up to date on all vaccines. This information is not intended to replace advice given to you by your health care provider. Make sure you discuss any questions you have with your healthcare provider. Document Revised: 02/06/2020 Document  Reviewed: 01/13/2018 Elsevier Patient Education  2022 [employer].

## 2020-10-31 NOTE — Assessment & Plan Note (Addendum)
Encouraged good sleep hygiene such as dark, quiet room. No blue/green glowing lights such as computer screens in bedroom. No alcohol or stimulants in evening. Cut down on caffeine as able. Regular exercise is helpful but not just prior to bed time. Doing well on Temazepam

## 2021-05-07 ENCOUNTER — Other Ambulatory Visit: Payer: Self-pay | Admitting: *Deleted

## 2021-05-07 DIAGNOSIS — E782 Mixed hyperlipidemia: Secondary | ICD-10-CM

## 2021-09-11 LAB — HM PAP SMEAR: HM Pap smear: NEGATIVE

## 2021-11-03 ENCOUNTER — Encounter: Payer: BC Managed Care – PPO | Admitting: Family Medicine

## 2021-12-20 LAB — NMR, LIPOPROFILE
Cholesterol, Total: 299 mg/dL — ABNORMAL HIGH (ref 100–199)
HDL Particle Number: 37.4 umol/L (ref >=30.5)
HDL-C: 77 mg/dL (ref >39)
LDL Particle Number: 2018 nmol/L — ABNORMAL HIGH (ref <1000)
LDL Size: 21.9 nm (ref >20.5)
LDL-C (NIH Calc): 209 mg/dL — ABNORMAL HIGH (ref 0–99)
LP-IR Score: <25 (ref <=45)
Small LDL Particle Number: 406 nmol/L (ref <=527)
Triglycerides: 80 mg/dL (ref 0–149)

## 2021-12-26 ENCOUNTER — Encounter (HOSPITAL_BASED_OUTPATIENT_CLINIC_OR_DEPARTMENT_OTHER): Payer: Self-pay | Admitting: Internal Medicine

## 2021-12-26 ENCOUNTER — Ambulatory Visit (HOSPITAL_BASED_OUTPATIENT_CLINIC_OR_DEPARTMENT_OTHER): Payer: BC Managed Care – PPO | Admitting: Internal Medicine

## 2021-12-26 VITALS — BP 105/61 | HR 78 | Ht 65.0 in | Wt 181.3 lb

## 2021-12-26 DIAGNOSIS — E7800 Pure hypercholesterolemia, unspecified: Secondary | ICD-10-CM

## 2021-12-26 DIAGNOSIS — E782 Mixed hyperlipidemia: Secondary | ICD-10-CM

## 2021-12-26 MED ORDER — ROSUVASTATIN CALCIUM 20 MG PO TABS: 20.0000 mg | ORAL_TABLET | Freq: Every day | ORAL | 3 refills | Status: DC

## 2021-12-26 NOTE — Patient Instructions (Signed)
Medication Instructions:  START rosuvastatin (crestor) 20mg  daily  *If you need a refill on your cardiac medications before your next appointment, please call your pharmacy*   Lab Work: FASTING lab work to check cholesterol in 3-52months -- complete about a week before next visit   If you have labs (blood work) drawn today and your tests are completely normal, you will receive your results only by: MyChart Message (if you have MyChart) OR A paper copy in the mail If you have any lab test that is abnormal or we need to change your treatment, we will call you to review the results.   Testing/Procedures: NONE   Follow-Up: At [facility], you and your health needs are our priority.  As part of our continuing mission to provide you with exceptional heart care, we have created designated Provider Care Teams.  These Care Teams include your primary Cardiologist (physician) and Advanced Practice Providers (APPs -  Physician Assistants and Nurse Practitioners) who all work together to provide you with the care you need, when you need it.  We recommend signing up for the patient portal called "MyChart".  Sign up information is provided on this After Visit Summary.  MyChart is used to connect with patients for Virtual Visits (Telemedicine).  Patients are able to view lab/test results, encounter notes, upcoming appointments, etc.  Non-urgent messages can be sent to your provider as well.   To learn more about what you can do with MyChart, go to [facility].    Your next appointment:   3-4 months with [email or web address]

## 2021-12-26 NOTE — Progress Notes (Signed)
LIPID CLINIC CONSULT NOTE  Chief Complaint:  Follow-up dyslipidemia  Primary Care Physician: Bradd Canary, MD  Primary Cardiologist:  None  HPI:  Martha Rhodes is a 56 y.o. female who is being seen today for the evaluation of dyslipidemia at the request of Bradd Canary, MD. 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  She is concerned due to recent findings of rising cholesterol.  She reports a longstanding history of elevated cholesterol even when she was much younger and much more active.  She still stays physically active and eats very healthy although recently has had some concern about weight gain.  Her labs in October showed total cholesterol 283 with HDL 85, LDL 182 and triglycerides 97.  She has no known coronary disease or prior cardiac events.  She has few other risk factors, denying any diabetes, hypertension, tobacco abuse or any significant history of early onset heart disease.  She does take supplemental estrogen and progesterone at low doses.  12/26/2021  Ms. [name] returns today for follow-up of dyslipidemia.  He went calcium scoring in February 2022 which showed 0 coronary calcium.  We were looking to manage her lipids primarily with diet and lifestyle modifications.  Unfortunately her cholesterol has gone up.  Labs recently showed an LDL particle number of 2018, increased from 1570, LDL-C of 209 (up from 178) and a marked increase in small LDL particle number which was previously undetectable now up to 406.  She does report that both of her parents in fact have high cholesterol including her mom who is on 40 mg of atorvastatin and her father who is on 80 mg.  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  PMHx:  Past Medical History:  Diagnosis Date   Dyslipidemia 10/31/2020   Insomnia 10/31/2020   Psoriasis     Past Surgical History:  Procedure Laterality Date    ADENOIDECTOMY     APPENDECTOMY     WISDOM TOOTH EXTRACTION      FAMHx:  Family History  Problem Relation Age of Onset   Dementia Mother    Heart disease Father    Stroke Father    High blood pressure Father    Hyperlipidemia Father    Cancer Maternal Aunt        colon cancer   Colon cancer Maternal Aunt        60s   Heart attack Maternal Aunt    Heart disease Paternal Grandmother    Stroke Paternal Grandfather    Heart disease Paternal Grandfather    Esophageal cancer Neg Hx    Rectal cancer Neg Hx    Stomach cancer Neg Hx     SOCHx:   reports that she has never smoked. She has never used smokeless tobacco. She reports current alcohol use of about 2.0 standard drinks of alcohol per week. She reports that she does not use drugs.  ALLERGIES:  No Known Allergies  ROS: Pertinent items noted in HPI and remainder of comprehensive ROS otherwise negative.  HOME MEDS: Current Outpatient Medications on File Prior to Visit  Medication Sig Dispense Refill   Cholecalciferol (VITAMIN D3) 50 MCG (2000 UT) CAPS Take by mouth.     clobetasol (OLUX) 0.05 % topical foam Apply topically 2 (two) times daily.     fluticasone (CUTIVATE) 0.05 % cream Apply topically 2 (two) times daily.     OVER THE COUNTER MEDICATION corticare  b cap 2 tablets in the morning and 2 tablets in the eveneing     OVER THE COUNTER MEDICATION compounded medication  PROGESTERONE SR (LACTOSE FREE)  Take one capsule by mouth daily at bedtime.     temazepam (RESTORIL) 7.5 MG capsule Take 7.5 mg by mouth at bedtime.     TOPICALS DOCUMENTATION OPTIME COMPOUNDED MEDICATION  BIEST 5/5 (ESTRIOL/ESTRADIOL) AVB ( )  Apply 1 ml to inner arm or thigh and rub in once daily in the morning.     TOPICALS DOCUMENTATION OPTIME COMPOUNDED MEDICATION  PROGESTERONE AVB ( ) CREAM  Apply 0.5 ml to inner thigh/arm each morning.     No current facility-administered medications on file prior to visit.    LABS/IMAGING: No results  found for this or any previous visit (from the past 48 hour(s)). No results found.  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   Mrs. [name] has had unfortunately recent increase in her lipids with particle number over 2000 and LDL-C over 200.  Both of her parents are on statins and although she had no coronary calcium, this is concerning for familial dyslipidemia, however her genetic testing was negative..  Her diet is overall very healthy.  At this point she is concerned about the increase in her cholesterol and feels that she should be treated.  I would agree with that.  I would recommend at least a 50% reduction in her lipids and feel that we could accomplish that best with a high intensity statin.  Would recommend rosuvastatin 20 mg daily.  We will plan to repeat lipid NMR in about 3 months.  Anastasia Fiedler, MD, [facility], FACP  [facility]  Medical Director of the Advanced Lipid Disorders &  Cardiovascular Risk Reduction Clinic Diplomate of the American Board of Clinical Lipidology Attending Cardiologist  Direct Dial: [phone]  Fax: [phone]  Website:  [email or web address] Martha Rhodes 12/26/2021, 4:06 PM

## 2022-04-14 ENCOUNTER — Telehealth: Payer: Self-pay | Admitting: Family Medicine

## 2022-04-14 LAB — NMR, LIPOPROFILE
Cholesterol, Total: 172 mg/dL (ref 100–199)
HDL Particle Number: 41.8 umol/L (ref >=30.5)
HDL-C: 72 mg/dL (ref >39)
LDL Particle Number: 1064 nmol/L — ABNORMAL HIGH (ref <1000)
LDL Size: 20.4 nm — ABNORMAL LOW (ref >20.5)
LDL-C (NIH Calc): 85 mg/dL (ref 0–99)
LP-IR Score: 27 (ref <=45)
Small LDL Particle Number: 616 nmol/L — ABNORMAL HIGH (ref <=527)
Triglycerides: 84 mg/dL (ref 0–149)

## 2022-04-14 NOTE — Telephone Encounter (Signed)
Called pt and appt with beck was made

## 2022-04-14 NOTE — Telephone Encounter (Signed)
Pt stated she started having sxs yesterday, congestion, fever, chill, etc. So she tested late last night and tested + for covid. She is hoping we can send in rx w/o an appt as we do not have anything. Please advise.   Pharmacy [facility] [id number] - [address] [place] - [address] [place], [place] [address] Phone: [phone]  Fax: [phone]

## 2022-04-15 ENCOUNTER — Telehealth (INDEPENDENT_AMBULATORY_CARE_PROVIDER_SITE_OTHER): Payer: BC Managed Care – PPO | Admitting: Family Medicine

## 2022-04-15 ENCOUNTER — Encounter: Payer: Self-pay | Admitting: Family Medicine

## 2022-04-15 DIAGNOSIS — U071 COVID-19: Secondary | ICD-10-CM | POA: Diagnosis not present

## 2022-04-15 MED ORDER — HYDROCOD POLI-CHLORPHE POLI ER 10-8 MG/5ML PO SUER: 5.0000 mL | Freq: Two times a day (BID) | ORAL | 0 refills | Status: AC | PRN

## 2022-04-15 MED ORDER — NIRMATRELVIR/RITONAVIR (PAXLOVID)TABLET: ORAL_TABLET | ORAL | 0 refills | Status: DC

## 2022-04-15 NOTE — Progress Notes (Signed)
Virtual Video Visit via MyChart Note  I connected with  Martha Rhodes on 04/15/22 at 11:00 AM EST by the video enabled telemedicine application for MyChart, and verified that I am speaking with the correct person using two identifiers.   I introduced myself as a Publishing rights manager with the practice. We discussed the limitations of evaluation and management by telemedicine and the availability of in person appointments. The patient expressed understanding and agreed to proceed.  Participating parties in this visit include: The patient and the nurse practitioner listed.  The patient is: At home I am: In the office -  [facility]  Subjective:    CC:  Chief Complaint  Patient presents with   Covid Positive    Monday evening tested positive Coughing, vomiting, fever, sore throat,     HPI: Martha Rhodes is a 56 y.o. year old female presenting today via MyChart today for positive COVID test.  Patient states that on Monday (2 days ago) she started with sore throat, fever, chills. Tested positive for COVID that evening. Reports sore throat is better now, but she is having cough that keeps her up at night, sneezing, nasal congestion, nausea (vomited once), fatigue, fever, chills. Patient denies any chest pain, palpitations, dyspnea, wheezing, edema, vision changes. So far she has only been taking ibuprofen and using supportive measures at home. Reports that she took Paxlovid last year when she had COVID and would like to take it again as her symptoms are quire bothersome.       Past medical history, Surgical history, Family history not pertinant except as noted below, Social history, Allergies, and medications have been entered into the medical record, reviewed, and corrections made.   Review of Systems:  All review of systems negative except what is listed in the HPI   Objective:    General:  Speaking clearly in complete sentences. Absent shortness of  breath noted.   Alert and oriented x3.   Normal judgment.  Absent acute distress.   Impression and Recommendations:    1. COVID-19 - nirmatrelvir/ritonavir EUA (PAXLOVID) 20 x 150 MG & 10 x 100MG  TABS; 1 dose p.o. twice daily for 5 days, may use any available formulation at pharmacy with the same total dosage.  Dispense: 30 tablet; Refill: 0 - chlorpheniramine-HYDROcodone (TUSSIONEX) 10-8 MG/5ML; Take 5 mLs by mouth every 12 (twelve) hours as needed for up to 5 days for cough (cough, will cause drowsiness.).  Dispense: 50 mL; Refill: 0  Stop your Crestor while you are on the Paxlovid.  Adding cough syrup for you to try.  Continue supportive measures including rest, hydration, humidifier use, steam showers, warm compresses to sinuses, warm liquids with lemon and honey, and over-the-counter cough, cold, and analgesics as needed.  Please contact office for follow-up if symptoms do not improve or worsen. Seek emergency care if symptoms become severe.   Follow-up if symptoms worsen or fail to improve.    I discussed the assessment and treatment plan with the patient. The patient was provided an opportunity to ask questions and all were answered. The patient agreed with the plan and demonstrated an understanding of the instructions.   The patient was advised to call back or seek an in-person evaluation if the symptoms worsen or if the condition fails to improve as anticipated.  I spent 20 minutes dedicated to the care of this patient on the date of this encounter to include pre-visit chart review of prior notes and results,  face-to-face time with the patient, and post-visit ordering of testing as indicated.   Clayborne Dana, NP

## 2022-04-15 NOTE — Patient Instructions (Signed)
Stop your Crestor while you are on the Paxlovid.  Adding cough syrup for you to try.  Continue supportive measures including rest, hydration, humidifier use, steam showers, warm compresses to sinuses, warm liquids with lemon and honey, and over-the-counter cough, cold, and analgesics as needed.  Please contact office for follow-up if symptoms do not improve or worsen. Seek emergency care if symptoms become severe.   Over the counter medications that may be helpful for symptoms:  Guaifenesin 1200 mg extended release tabs twice daily, with plenty of water For cough and congestion Brand name: Mucinex   Pseudoephedrine 30 mg, one or two tabs every 4 to 6 hours For sinus congestion Brand name: Sudafed You must get this from the pharmacy counter.  Oxymetazoline nasal spray each morning, one spray in each nostril, for NO MORE THAN 3 days  For nasal and sinus congestion Brand name: Afrin Saline nasal spray or Saline Nasal Irrigation 3-5 times a day For nasal and sinus congestion Brand names: Ocean or AYR Fluticasone nasal spray, one spray in each nostril, each morning (after oxymetazoline and saline, if used) For nasal and sinus congestion Brand name: Flonase Warm salt water gargles  For sore throat Every few hours as needed Alternate ibuprofen 400-600 mg and acetaminophen 1000 mg every 4-6 hours For fever, body aches, headache Brand names: Motrin or Advil and Tylenol Dextromethorphan 12-hour cough version 30 mg every 12 hours  For cough Brand name: Delsym Stop all other cold medications for now (Nyquil, Dayquil, Tylenol Cold, Theraflu, etc) and other non-prescription cough/cold preparations. Many of these have the same ingredients listed above and could cause an overdose of medication.   Herbal treatments that have been shown to be helpful in some patients include: Vitamin C 1000mg  per day Vitamin D 4000iU per day Zinc 100mg  per day Quercetin 25-500mg  twice a day Melatonin 5-10mg  at  bedtime  General Instructions Allow your body to rest Drink PLENTY of fluids Isolate yourself from everyone, even family, until test results have returned  If your COVID-19 test is positive Then you ARE INFECTED and you can pass the virus to others You must quarantine from others for a minimum of  5 days since symptoms started AND You are fever free for 24 hours WITHOUT any medication to reduce fever AND Your symptoms are improving Wear mask for the next 5 days If not improved by day 5, quarantine for the full 10 days. Do not go to the store or other public areas Do not go around household members who are not known to be infected with COVID-19 If you MUST leave your area of quarantine (example: go to a bathroom you share with others in your home), you must Wear a mask Wash your hands thoroughly Wipe down any surfaces you touch Do not share food, drinks, towels, or other items with other persons Dispose of your own tissues, food containers, etc  Once you have recovered, please continue good preventive care measures, including:  wearing a mask when in public wash your hands frequently avoid touching your face/nose/eyes cover coughs/sneezes with the inside of your elbow stay out of crowds keep a 6 foot distance from others  If you develop severe shortness of breath, uncontrolled fevers, coughing up blood, confusion, chest pain, or signs of dehydration (such as significantly decreased urine amounts or dizziness with standing) please go to the ER.

## 2022-04-20 ENCOUNTER — Telehealth: Payer: Self-pay | Admitting: Internal Medicine

## 2022-04-20 NOTE — Telephone Encounter (Signed)
Appt on 04/22/2022 switched to Virtual Visit as per patient's request due to Covid. Jim Like M[facility] RN CCM

## 2022-04-20 NOTE — Telephone Encounter (Signed)
Pt has an appt on 04/22/22. Pt wants to know if this can virtual instead because she has covid.

## 2022-04-22 ENCOUNTER — Telehealth (INDEPENDENT_AMBULATORY_CARE_PROVIDER_SITE_OTHER): Payer: BC Managed Care – PPO | Admitting: Internal Medicine

## 2022-04-22 ENCOUNTER — Encounter (HOSPITAL_BASED_OUTPATIENT_CLINIC_OR_DEPARTMENT_OTHER): Payer: Self-pay | Admitting: Internal Medicine

## 2022-04-22 VITALS — Ht 65.0 in | Wt 176.0 lb

## 2022-04-22 DIAGNOSIS — E7800 Pure hypercholesterolemia, unspecified: Secondary | ICD-10-CM | POA: Diagnosis not present

## 2022-04-22 NOTE — Progress Notes (Signed)
Virtual Visit via Video Note   Because of Martha Rhodes's co-morbid illnesses, she is at least at moderate risk for complications without adequate follow up.  This format is felt to be most appropriate for this patient at this time.  All issues noted in this document were discussed and addressed.  A limited physical exam was performed with this format.  Please refer to the patient's chart for her consent to telehealth for [facility].      Date:  04/22/2022   ID:  Martha Rhodes, DOB [birth year], MRN [id number] The patient was identified using 2 identifiers.  Evaluation Performed:  Follow-Up Visit  Patient Location:  [address] [place] [address]  Provider location:   [address] [place], [place] [address]  PCP:  Bradd Canary, MD  Cardiologist:  None Electrophysiologist:  None   Chief Complaint:  Follow-up dyslipidemia  History of Present Illness:    Martha Rhodes is a 56 y.o. female who presents via audio/video conferencing for a telehealth visit today.  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  She is concerned due to recent findings of rising cholesterol.  She reports a longstanding history of elevated cholesterol even when she was much younger and much more active.  She still stays physically active and eats very healthy although recently has had some concern about weight gain.  Her labs in October showed total cholesterol 283 with HDL 85, LDL 182 and triglycerides 97.  She has no known coronary disease or prior cardiac events.  She has few other risk factors, denying any diabetes, hypertension, tobacco abuse or any significant history of early onset heart disease.  She does take supplemental estrogen and progesterone at low doses.  12/26/2021  Martha Rhodes returns today for follow-up of dyslipidemia.  He went calcium scoring in February 2022  which showed 0 coronary calcium.  We were looking to manage her lipids primarily with diet and lifestyle modifications.  Unfortunately her cholesterol has gone up.  Labs recently showed an LDL particle number of 2018, increased from 1570, LDL-C of 209 (up from 178) and a marked increase in small LDL particle number which was previously undetectable now up to 406.  She does report that both of her parents in fact have high cholesterol including her mom who is on 40 mg of atorvastatin and her father who is on 80 mg.  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  04/22/2022  Martha Rhodes returns today for follow-up of her dyslipidemia.  She is seen via video visit as unfortunately she recently had COVID-19.  She is currently on treatment with Paxlovid and had stopped her rosuvastatin temporarily.  Prior to this, however she had repeat lipid testing which shows significant improvement in her numbers.  Her LDL particle number is 1064, down from 2018 just 4 months ago.  LDL-C of 85, down from 209 and her total cholesterol was 172 (down from 299).  There was a small increase in small LDL particle number, however her overall LDL particle numbers are much lower.  She is tolerating therapy without difficulty.  Prior CV studies:   The following studies were reviewed today:  Chart reviewed, lab work  PMHx:  Past Medical History:  Diagnosis Date   Dyslipidemia 10/31/2020   Insomnia 10/31/2020   Psoriasis     Past Surgical History:  Procedure Laterality Date   ADENOIDECTOMY  APPENDECTOMY     WISDOM TOOTH EXTRACTION      FAMHx:  Family History  Problem Relation Age of Onset   Dementia Mother    Heart disease Father    Stroke Father    High blood pressure Father    Hyperlipidemia Father    Cancer Maternal Aunt        colon cancer   Colon cancer Maternal Aunt        60s   Heart attack Maternal Aunt    Heart disease Paternal Grandmother    Stroke Paternal Grandfather    Heart disease Paternal  Grandfather    Esophageal cancer Neg Hx    Rectal cancer Neg Hx    Stomach cancer Neg Hx     SOCHx:   reports that she has never smoked. She has never used smokeless tobacco. She reports current alcohol use of about 2.0 standard drinks of alcohol per week. She reports that she does not use drugs.  ALLERGIES:  No Known Allergies  MEDS:  Current Meds  Medication Sig   Cholecalciferol (VITAMIN D3) 50 MCG (2000 UT) CAPS Take by mouth.   clobetasol (OLUX) 0.05 % topical foam Apply topically 2 (two) times daily.   fluticasone (CUTIVATE) 0.05 % cream Apply topically 2 (two) times daily.   nirmatrelvir/ritonavir EUA (PAXLOVID) 20 x 150 MG & 10 x 100MG  TABS 1 dose p.o. twice daily for 5 days, may use any available formulation at pharmacy with the same total dosage.   OVER THE COUNTER MEDICATION corticare b cap 2 tablets in the morning and 2 tablets in the eveneing   OVER THE COUNTER MEDICATION compounded medication  PROGESTERONE SR (LACTOSE FREE)  Take one capsule by mouth daily at bedtime.   rosuvastatin (CRESTOR) 20 MG tablet Take 1 tablet (20 mg total) by mouth daily.   temazepam (RESTORIL) 7.5 MG capsule Take 7.5 mg by mouth at bedtime.   TOPICALS DOCUMENTATION OPTIME COMPOUNDED MEDICATION  BIEST 5/5 (ESTRIOL/ESTRADIOL) AVB ( )  Apply 1 ml to inner arm or thigh and rub in once daily in the morning.   TOPICALS DOCUMENTATION OPTIME COMPOUNDED MEDICATION  PROGESTERONE AVB ( ) CREAM  Apply 0.5 ml to inner thigh/arm each morning.     ROS: Pertinent items noted in HPI and remainder of comprehensive ROS otherwise negative.  Labs/Other Tests and Data Reviewed:    Recent Labs: No results found for requested labs within last 365 days.   Recent Lipid Panel Lab Results  Component Value Date/Time   CHOL 283 (A) 03/04/2020 12:00 AM   TRIG 97 03/04/2020 12:00 AM   HDL 85 (A) 03/04/2020 12:00 AM   LDLCALC 182 03/04/2020 12:00 AM    Wt Readings from Last 3 Encounters:  04/22/22 176  lb (79.8 kg)  12/26/21 181 lb 4.8 oz (82.2 kg)  10/31/20 174 lb 6.4 oz (79.1 kg)     Exam:    Vital Signs:  Ht 5\' 5"  (1.651 m)   Wt 176 lb (79.8 kg)   BMI 29.29 kg/m    General appearance: alert and no distress Lungs: No wheezing Abdomen: mildly overweight Extremities: extremities normal, atraumatic, no cyanosis or edema Neurologic: Grossly normal  ASSESSMENT & PLAN:    Mixed dyslipidemia with high HDL, 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 I would recommend continuing with her current dose which is well-tolerated. Will plan repeat lipids and follow-up  annually or sooner as necessary.  Patient Risk:   After full review of this patients clinical status, I feel that they are at least moderate risk at this time.  Time:   Today, I have spent 15 minutes with the patient with telehealth technology discussing dyslipidemia.     Medication Adjustments/Labs and Tests Ordered: Current medicines are reviewed at length with the patient today.  Concerns regarding medicines are outlined above.   Tests Ordered: Orders Placed This Encounter  Procedures   NMR, lipoprofile    Medication Changes: No orders of the defined types were placed in this encounter.   Disposition:  in 1 year(s)  Chrystie Nose, MD, [facility], FACP  [place]  [facility]  Medical Director of the Advanced Lipid Disorders &  Cardiovascular Risk Reduction Clinic Diplomate of the American Board of Clinical Lipidology Attending Cardiologist  Direct Dial: [phone]  Fax: [phone]  Website:  [email or web address]  Chrystie Nose, MD  04/22/2022 8:23 PM

## 2022-04-22 NOTE — Patient Instructions (Signed)
Medication Instructions:  NO CHANGES  *If you need a refill on your cardiac medications before your next appointment, please call your pharmacy*   Lab Work: FASTING lab work to check cholesterol in 1 year   If you have labs (blood work) drawn today and your tests are completely normal, you will receive your results only by: MyChart Message (if you have MyChart) OR A paper copy in the mail If you have any lab test that is abnormal or we need to change your treatment, we will call you to review the results.   Follow-Up: At [facility], you and your health needs are our priority.  As part of our continuing mission to provide you with exceptional heart care, we have created designated Provider Care Teams.  These Care Teams include your primary Cardiologist (physician) and Advanced Practice Providers (APPs -  Physician Assistants and Nurse Practitioners) who all work together to provide you with the care you need, when you need it.  We recommend signing up for the patient portal called "MyChart".  Sign up information is provided on this After Visit Summary.  MyChart is used to connect with patients for Virtual Visits (Telemedicine).  Patients are able to view lab/test results, encounter notes, upcoming appointments, etc.  Non-urgent messages can be sent to your provider as well.   To learn more about what you can do with MyChart, go to [email or web address].    Your next appointment:   12 month(s)  The format for your next appointment:   In Person  Provider:   Kenneth Hilty MD 

## 2022-04-22 NOTE — Telephone Encounter (Signed)
For todays appointment

## 2022-04-27 DIAGNOSIS — Z0000 Encounter for general adult medical examination without abnormal findings: Secondary | ICD-10-CM | POA: Insufficient documentation

## 2022-04-27 NOTE — Assessment & Plan Note (Signed)
Encouraged good sleep hygiene such as dark, quiet room. No blue/green glowing lights such as computer screens in bedroom. No alcohol or stimulants in evening. Cut down on caffeine as able. Regular exercise is helpful but not just prior to bed time.  

## 2022-04-27 NOTE — Assessment & Plan Note (Addendum)
Patient encouraged to maintain heart healthy diet, regular exercise, adequate sleep. Consider daily probiotics. Take medications as prescribed  Labs ordered and reviewed  MGM 2021, completes with GYN records requested Colonoscopy 2017 repeat in 2027 Pap follows with GYN, requested records Patient declines vaccines, could consider flu, Tdap, covid, Shingrix is the new shingles shot, 2 shots over 2-6 months, confirm coverage with insurance and document, then can return here for shots with nurse appt or at pharmacy

## 2022-04-27 NOTE — Assessment & Plan Note (Signed)
Encourage heart healthy diet such as MIND or DASH diet, increase exercise, avoid trans fats, simple carbohydrates and processed foods, consider a krill or fish or flaxseed oil cap daily.  °

## 2022-04-28 ENCOUNTER — Ambulatory Visit (INDEPENDENT_AMBULATORY_CARE_PROVIDER_SITE_OTHER): Payer: BC Managed Care – PPO | Admitting: Family Medicine

## 2022-04-28 ENCOUNTER — Other Ambulatory Visit: Payer: Self-pay

## 2022-04-28 ENCOUNTER — Encounter: Payer: Self-pay | Admitting: Family Medicine

## 2022-04-28 VITALS — BP 110/68 | HR 76 | Temp 97.6°F | Ht 65.0 in | Wt 176.6 lb

## 2022-04-28 DIAGNOSIS — E559 Vitamin D deficiency, unspecified: Secondary | ICD-10-CM | POA: Diagnosis not present

## 2022-04-28 DIAGNOSIS — Z0000 Encounter for general adult medical examination without abnormal findings: Secondary | ICD-10-CM | POA: Diagnosis not present

## 2022-04-28 DIAGNOSIS — E663 Overweight: Secondary | ICD-10-CM

## 2022-04-28 DIAGNOSIS — E785 Hyperlipidemia, unspecified: Secondary | ICD-10-CM

## 2022-04-28 DIAGNOSIS — G4700 Insomnia, unspecified: Secondary | ICD-10-CM

## 2022-04-28 DIAGNOSIS — L409 Psoriasis, unspecified: Secondary | ICD-10-CM

## 2022-04-28 MED ORDER — CLOBETASOL PROPIONATE 0.05 % EX FOAM: Freq: Two times a day (BID) | CUTANEOUS | 2 refills | Status: AC

## 2022-04-28 NOTE — Progress Notes (Signed)
Subjective:   By signing my name below, I, Vickey Sages, attest that this documentation has been prepared under the direction and in the presence of Bradd Canary, MD., 04/28/2022.   Patient ID: Martha Rhodes, female    DOB: 1965-09-14, 56 y.o.   MRN: [id number]  No chief complaint on file.  HPI Patient is in today for a comprehensive physical exam and follow up on chronic medical concerns. Denies CP/ palp/ SOB/ HA/ congestion/ fevers/ GI or GU c/o.  COVID-19 Patient contracted COVID-19 for the second time in 03/2022 and was seen by Hyman Hopes, NP, on 04/15/2022 who prescribed Paxlovid. She has recovered and has no lasting symptoms.   Exercise/Weight She walks 3.5 miles daily to remain active but is still concerned about her weight. Body mass index is 29.39 kg/m. She is considering taking Mounjaro and [id number]. Wt Readings from Last 3 Encounters:  04/28/22 176 lb 9.6 oz (80.1 kg)  04/22/22 176 lb (79.8 kg)  12/26/21 181 lb 4.8 oz (82.2 kg)   Hypercholesterolemia Patient has maternal and paternal FHx of heart disease and has been seeing her cardiologist Dr. Rennis Golden to manage her hypercholesterolemia. She is currently taking Rosuvastatin 20 mg which is tolerable.  Lab Results  Component Value Date   CHOL 283 (A) 03/04/2020   HDL 85 (A) 03/04/2020   LDLCALC 182 03/04/2020   TRIG 97 03/04/2020   Menopause She reports that menopause disrupted her sleep habits and she was prescribed Temazepam 7.5 mg by her OBGYN which was helpful. She reports no recent sleep disturbances.  Psoriasis Patient is currently using Clobetasol 0.05% topical foam and Fluticasone 0.05% cream to manage her psoriasis. She is using these medications very generously and her psoriasis is controlled and not severe.  Refills She is requesting a refill on Clobetasol 0.05% topical foam.  Past Medical History:  Diagnosis Date   Dyslipidemia 10/31/2020   Insomnia 10/31/2020   Psoriasis    Past Surgical  History:  Procedure Laterality Date   ADENOIDECTOMY     APPENDECTOMY     WISDOM TOOTH EXTRACTION     Family History  Problem Relation Age of Onset   Dementia Mother    Heart disease Father    Stroke Father    High blood pressure Father    Hyperlipidemia Father    Cancer Maternal Aunt        colon cancer   Colon cancer Maternal Aunt        60s   Heart attack Maternal Aunt    Heart disease Paternal Grandmother    Stroke Paternal Grandfather    Heart disease Paternal Grandfather    Esophageal cancer Neg Hx    Rectal cancer Neg Hx    Stomach cancer Neg Hx    Social History   Socioeconomic History   Marital status: Married    Spouse name: Not on file   Number of children: Not on file   Years of education: Not on file   Highest education level: Not on file  Occupational History   Not on file  Tobacco Use   Smoking status: Never   Smokeless tobacco: Never  Substance and Sexual Activity   Alcohol use: Yes    Alcohol/week: 2.0 standard drinks of alcohol    Types: 2 Glasses of wine per week    Comment: occasional glass of wine 1-2/month   Drug use: No   Sexual activity: Yes    Birth control/protection: Post-menopausal  Other Topics Concern  Not on file  Social History Narrative   Lives with husband and youngest son. No dietary restrictions, wears seat belts and eats a heart healthy diet. Wears her seatbelt in the car.    Social Determinants of Health   Financial Resource Strain: Not on file  Food Insecurity: Not on file  Transportation Needs: Not on file  Physical Activity: Not on file  Stress: Not on file  Social Connections: Not on file  Intimate Partner Violence: Not on file   Outpatient Medications Prior to Visit  Medication Sig Dispense Refill   Cholecalciferol (VITAMIN D3) 50 MCG (2000 UT) CAPS Take by mouth.     clobetasol (OLUX) 0.05 % topical foam Apply topically 2 (two) times daily.     fluticasone (CUTIVATE) 0.05 % cream Apply topically 2 (two) times  daily.     nirmatrelvir/ritonavir EUA (PAXLOVID) 20 x 150 MG & 10 x 100MG  TABS 1 dose p.o. twice daily for 5 days, may use any available formulation at pharmacy with the same total dosage. 30 tablet 0   OVER THE COUNTER MEDICATION corticare b cap 2 tablets in the morning and 2 tablets in the eveneing     OVER THE COUNTER MEDICATION compounded medication  PROGESTERONE SR (LACTOSE FREE)  Take one capsule by mouth daily at bedtime.     rosuvastatin (CRESTOR) 20 MG tablet Take 1 tablet (20 mg total) by mouth daily. 90 tablet 3   temazepam (RESTORIL) 7.5 MG capsule Take 7.5 mg by mouth at bedtime.     TOPICALS DOCUMENTATION OPTIME COMPOUNDED MEDICATION  BIEST 5/5 (ESTRIOL/ESTRADIOL) AVB ( )  Apply 1 ml to inner arm or thigh and rub in once daily in the morning.     TOPICALS DOCUMENTATION OPTIME COMPOUNDED MEDICATION  PROGESTERONE AVB ( ) CREAM  Apply 0.5 ml to inner thigh/arm each morning.     No facility-administered medications prior to visit.   No Known Allergies  Review of Systems  Constitutional:  Negative for chills and fever.  HENT:  Negative for congestion.   Respiratory:  Negative for shortness of breath.   Cardiovascular:  Negative for chest pain and palpitations.  Gastrointestinal:  Negative for abdominal pain, blood in stool, constipation, diarrhea, nausea and vomiting.  Genitourinary:  Negative for dysuria, frequency, hematuria and urgency.  Skin:           Neurological:  Negative for headaches.      Objective:    Physical Exam Constitutional:      General: She is not in acute distress.    Appearance: Normal appearance. She is normal weight. She is not ill-appearing.  HENT:     Head: Normocephalic and atraumatic.     Right Ear: Tympanic membrane, ear canal and external ear normal.     Left Ear: Tympanic membrane, ear canal and external ear normal.     Nose: Nose normal.     Mouth/Throat:     Mouth: Mucous membranes are moist.     Pharynx: Oropharynx is clear.   Eyes:     General:        Right eye: No discharge.        Left eye: No discharge.     Extraocular Movements: Extraocular movements intact.     Right eye: No nystagmus.     Left eye: No nystagmus.     Pupils: Pupils are equal, round, and reactive to light.  Neck:     Vascular: No carotid bruit.  Cardiovascular:     Rate and Rhythm:  Normal rate and regular rhythm.     Pulses: Normal pulses.     Heart sounds: Normal heart sounds. No murmur heard.    No gallop.  Pulmonary:     Effort: Pulmonary effort is normal. No respiratory distress.     Breath sounds: Normal breath sounds. No wheezing or rales.  Abdominal:     General: Bowel sounds are normal.     Palpations: Abdomen is soft.     Tenderness: There is no abdominal tenderness. There is no guarding.  Musculoskeletal:        General: Normal range of motion.     Cervical back: Normal range of motion.     Right lower leg: No edema.     Left lower leg: No edema.     Comments: Muscle strength 5/5 on upper and lower extremities.   Lymphadenopathy:     Cervical: No cervical adenopathy.  Skin:    General: Skin is warm and dry.  Neurological:     Mental Status: She is alert and oriented to person, place, and time.     Sensory: Sensation is intact.     Motor: Motor function is intact.     Coordination: Coordination is intact.     Deep Tendon Reflexes:     Reflex Scores:      Patellar reflexes are 2+ on the right side and 2+ on the left side. Psychiatric:        Mood and Affect: Mood normal.        Behavior: Behavior normal.        Judgment: Judgment normal.    There were no vitals taken for this visit. Wt Readings from Last 3 Encounters:  04/22/22 176 lb (79.8 kg)  12/26/21 181 lb 4.8 oz (82.2 kg)  10/31/20 174 lb 6.4 oz (79.1 kg)   Diabetic Foot Exam - Simple   No data filed    Lab Results  Component Value Date   WBC 5.5 03/04/2020   HGB 13.7 03/04/2020   HCT 41 03/04/2020   PLT 260 03/04/2020   CHOL 283 (A)  03/04/2020   TRIG 97 03/04/2020   HDL 85 (A) 03/04/2020   LDLCALC 182 03/04/2020   ALT 19 03/04/2020   AST 22 03/04/2020   NA 141 03/04/2020   K 5.0 03/04/2020   CL 102 03/04/2020   CREATININE 0.9 03/04/2020   BUN 22 (A) 03/04/2020   CO2 22 03/04/2020   TSH 3.05 03/04/2020   HGBA1C 5.4 03/04/2020   Lab Results  Component Value Date   TSH 3.05 03/04/2020   Lab Results  Component Value Date   WBC 5.5 03/04/2020   HGB 13.7 03/04/2020   HCT 41 03/04/2020   PLT 260 03/04/2020   Lab Results  Component Value Date   NA 141 03/04/2020   K 5.0 03/04/2020   CO2 22 03/04/2020   BUN 22 (A) 03/04/2020   CREATININE 0.9 03/04/2020   ALKPHOS 92 03/04/2020   AST 22 03/04/2020   ALT 19 03/04/2020   ALBUMIN 4.7 03/04/2020   CALCIUM 9.5 03/04/2020   Lab Results  Component Value Date   CHOL 283 (A) 03/04/2020   Lab Results  Component Value Date   HDL 85 (A) 03/04/2020   Lab Results  Component Value Date   LDLCALC 182 03/04/2020   Lab Results  Component Value Date   TRIG 97 03/04/2020   No results found for: "CHOLHDL" Lab Results  Component Value Date   HGBA1C 5.4 03/04/2020  Assessment & Plan:   Colonoscopy: Last completed on 08/02/2015.  -The entire examined colon is normal on direct and retroflexion views. Possible subepitheial or extrinsic mass impression was noted in the ascending colon -No specimens collected.  Repeat in 10 years.  Mammogram: Last completed on 02/29/2020. No mammographic evidence of malignancy. Repeat in 1 year.  Pap Smear: Last completed on 10/01/2017. Normal results. Repeat in 3 years.   Clobetasol 0.05% topical foam has been refilled today.  Patient has been told to consider Mounjaro and Wegovy to manage her weight.  Patient has been informed about eating healthy foods, hydrating, and meeting a minimum of 4000 daily steps.   Patient has been provided with advanced care planning documents.  Patient has been told to consider the  COVID-19 immunization in 2 months.  Routine blood work will be completed today.  Problem List Items Addressed This Visit       Other   Insomnia   Dyslipidemia - Primary   Preventative health care   No orders of the defined types were placed in this encounter.  I, Vickey SagesMohammed Iqbal, personally preformed the services described in this documentation.  All medical record entries made by the scribe were at my direction and in my presence.  I have reviewed the chart and discharge instructions (if applicable) and agree that the record reflects my personal performance and is accurate and complete. 04/28/2022  I,Mohammed Iqbal,acting as a scribe for Danise EdgeStacey Corlene Sabia, MD.,have documented all relevant documentation on the behalf of Danise EdgeStacey Emilianna Barlowe, MD,as directed by  Danise EdgeStacey Saachi Zale, MD while in the presence of Danise EdgeStacey Elzena Muston, MD.  Vickey SagesMohammed Iqbal

## 2022-04-28 NOTE — Patient Instructions (Addendum)
Mounjaro or a GLP or GIP for weight loss  Wegovy already approved  For sleep: Magnesium Glycinate 200-400 mg at bedtime  Yerba Matte tea do not drink  Preventive Care 33-58 Years Old, Female Preventive care refers to lifestyle choices and visits with your health care provider that can promote health and wellness. Preventive care visits are also called wellness exams. What can I expect for my preventive care visit? Counseling Your health care provider may ask you questions about your: Medical history, including: Past medical problems. Family medical history. Pregnancy history. Current health, including: Menstrual cycle. Method of birth control. Emotional well-being. Home life and relationship well-being. Sexual activity and sexual health. Lifestyle, including: Alcohol, nicotine or tobacco, and drug use. Access to firearms. Diet, exercise, and sleep habits. Work and work Statistician. Sunscreen use. Safety issues such as seatbelt and bike helmet use. Physical exam Your health care provider will check your: Height and weight. These may be used to calculate your BMI (body mass index). BMI is a measurement that tells if you are at a healthy weight. Waist circumference. This measures the distance around your waistline. This measurement also tells if you are at a healthy weight and may help predict your risk of certain diseases, such as type 2 diabetes and high blood pressure. Heart rate and blood pressure. Body temperature. Skin for abnormal spots. What immunizations do I need?  Vaccines are usually given at various ages, according to a schedule. Your health care provider will recommend vaccines for you based on your age, medical history, and lifestyle or other factors, such as travel or where you work. What tests do I need? Screening Your health care provider may recommend screening tests for certain conditions. This may include: Lipid and cholesterol levels. Diabetes screening.  This is done by checking your blood sugar (glucose) after you have not eaten for a while (fasting). Pelvic exam and Pap test. Hepatitis B test. Hepatitis C test. HIV (human immunodeficiency virus) test. STI (sexually transmitted infection) testing, if you are at risk. Lung cancer screening. Colorectal cancer screening. Mammogram. Talk with your health care provider about when you should start having regular mammograms. This may depend on whether you have a family history of breast cancer. BRCA-related cancer screening. This may be done if you have a family history of breast, ovarian, tubal, or peritoneal cancers. Bone density scan. This is done to screen for osteoporosis. Talk with your health care provider about your test results, treatment options, and if necessary, the need for more tests. Follow these instructions at home: Eating and drinking  Eat a diet that includes fresh fruits and vegetables, whole grains, lean protein, and low-fat dairy products. Take vitamin and mineral supplements as recommended by your health care provider. Do not drink alcohol if: Your health care provider tells you not to drink. You are pregnant, may be pregnant, or are planning to become pregnant. If you drink alcohol: Limit how much you have to 0-1 drink a day. Know how much alcohol is in your drink. In the [place] one drink equals one 12 oz bottle of beer (355 mL), one 5 oz glass of wine (148 mL), or one 1 oz glass of hard liquor (44 mL). Lifestyle Brush your teeth every morning and night with fluoride toothpaste. Floss one time each day. Exercise for at least 30 minutes 5 or more days each week. Do not use any products that contain nicotine or tobacco. These products include cigarettes, chewing tobacco, and vaping devices, such as e-cigarettes. If  you need help quitting, ask your health care provider. Do not use drugs. If you are sexually active, practice safe sex. Use a condom or other form of protection  to prevent STIs. If you do not wish to become pregnant, use a form of birth control. If you plan to become pregnant, see your health care provider for a prepregnancy visit. Take aspirin only as told by your health care provider. Make sure that you understand how much to take and what form to take. Work with your health care provider to find out whether it is safe and beneficial for you to take aspirin daily. Find healthy ways to manage stress, such as: Meditation, yoga, or listening to music. Journaling. Talking to a trusted person. Spending time with friends and family. Minimize exposure to UV radiation to reduce your risk of skin cancer. Safety Always wear your seat belt while driving or riding in a vehicle. Do not drive: If you have been drinking alcohol. Do not ride with someone who has been drinking. When you are tired or distracted. While texting. If you have been using any mind-altering substances or drugs. Wear a helmet and other protective equipment during sports activities. If you have firearms in your house, make sure you follow all gun safety procedures. Seek help if you have been physically or sexually abused. What's next? Visit your health care provider once a year for an annual wellness visit. Ask your health care provider how often you should have your eyes and teeth checked. Stay up to date on all vaccines. This information is not intended to replace advice given to you by your health care provider. Make sure you discuss any questions you have with your health care provider. Document Revised: 10/30/2020 Document Reviewed: 10/30/2020 Elsevier Patient Education  [employer].

## 2022-04-29 DIAGNOSIS — E559 Vitamin D deficiency, unspecified: Secondary | ICD-10-CM | POA: Insufficient documentation

## 2022-04-29 LAB — CBC WITH DIFFERENTIAL/PLATELET
Basophils Absolute: 0 10*3/uL (ref 0.0–0.1)
Basophils Relative: 0.5 % (ref 0.0–3.0)
Eosinophils Absolute: 0.1 10*3/uL (ref 0.0–0.7)
Eosinophils Relative: 0.8 % (ref 0.0–5.0)
HCT: 41.7 % (ref 36.0–46.0)
Hemoglobin: 13.8 g/dL (ref 12.0–15.0)
Lymphocytes Relative: 18.4 % (ref 12.0–46.0)
Lymphs Abs: 1.7 10*3/uL (ref 0.7–4.0)
MCHC: 33.1 g/dL (ref 30.0–36.0)
MCV: 91.1 fl (ref 78.0–100.0)
Monocytes Absolute: 0.6 10*3/uL (ref 0.1–1.0)
Monocytes Relative: 6.6 % (ref 3.0–12.0)
Neutro Abs: 6.6 10*3/uL (ref 1.4–7.7)
Neutrophils Relative %: 73.7 % (ref 43.0–77.0)
Platelets: 298 10*3/uL (ref 150.0–400.0)
RBC: 4.57 Mil/uL (ref 3.87–5.11)
RDW: 12.9 % (ref 11.5–15.5)
WBC: 9 10*3/uL (ref 4.0–10.5)

## 2022-04-29 LAB — COMPREHENSIVE METABOLIC PANEL
ALT: 25 U/L (ref 0–35)
AST: 24 U/L (ref 0–37)
Albumin: 4.8 g/dL (ref 3.5–5.2)
Alkaline Phosphatase: 59 U/L (ref 39–117)
BUN: 20 mg/dL (ref 6–23)
CO2: 27 mEq/L (ref 19–32)
Calcium: 9.7 mg/dL (ref 8.4–10.5)
Chloride: 102 mEq/L (ref 96–112)
Creatinine, Ser: 0.88 mg/dL (ref 0.40–1.20)
GFR: 72.55 mL/min (ref >60.00)
Glucose, Bld: 76 mg/dL (ref 70–99)
Potassium: 4.4 mEq/L (ref 3.5–5.1)
Sodium: 138 mEq/L (ref 135–145)
Total Bilirubin: 0.4 mg/dL (ref 0.2–1.2)
Total Protein: 7.1 g/dL (ref 6.0–8.3)

## 2022-04-29 LAB — VITAMIN D 25 HYDROXY (VIT D DEFICIENCY, FRACTURES): VITD: 48.2 ng/mL (ref 30.00–100.00)

## 2022-04-29 LAB — TSH: TSH: 2.71 u[IU]/mL (ref 0.35–5.50)

## 2022-04-29 NOTE — Assessment & Plan Note (Signed)
Maintain DASH or MIND diet, decrease po intake and increase exercise as tolerated. Needs 7-8 hours of sleep nightly. Avoid trans fats, eat small, frequent meals every 4-5 hours with lean proteins, complex carbs and healthy fats. Minimize simple carbs, high fat foods and processed foods. Patient will consider GLP1 or GIP med in 2024 if she continues to struggle with weight loss

## 2022-04-29 NOTE — Assessment & Plan Note (Signed)
Given refill on clobetasol foam that has been helpful historically

## 2022-04-29 NOTE — Assessment & Plan Note (Signed)
Supplement and monitor 

## 2022-09-16 IMAGING — CT CT CARDIAC CORONARY ARTERY CALCIUM SCORE
3 series · 14 of 20 positions shown, 15 images · non-contrast
Comparison: None.
COMPARISON: None.

Addendum:
EXAM:
OVER-READ INTERPRETATION  CT CHEST

The following report is an over-read performed by radiologist Dr.
Nassera Sire [REDACTED] on 07/03/2020. This over-read
does not include interpretation of cardiac or coronary anatomy or
pathology. The calcium score interpretation by the cardiologist is
attached.
CLINICAL DATA: Risk stratification
Coronary Calcium Score
TECHNIQUE: The patient was scanned on a Siemens Force scanner. Axial
non-contrast 3 mm slices were carried out through the heart. The
data set was analyzed on a dedicated work station and scored using
the Agatson method.

[Series 2: casc 3.0 bv41 2 bestdiast 72 % · axial · 0.37mm/px · z∈[-199,-127]mm · 4 of 42 slices shown, 5 images]
[im 9/42  vessel]
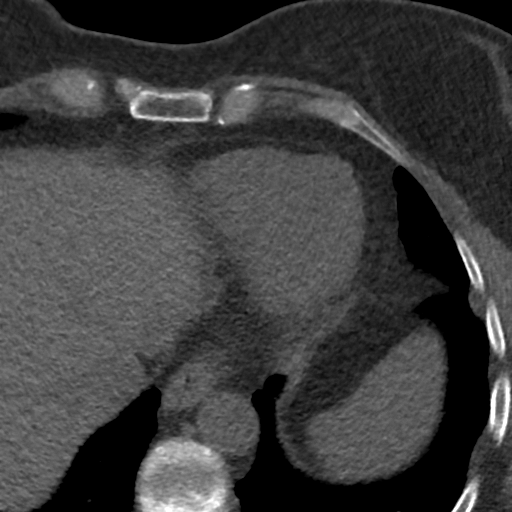
[im 9/42  lung]
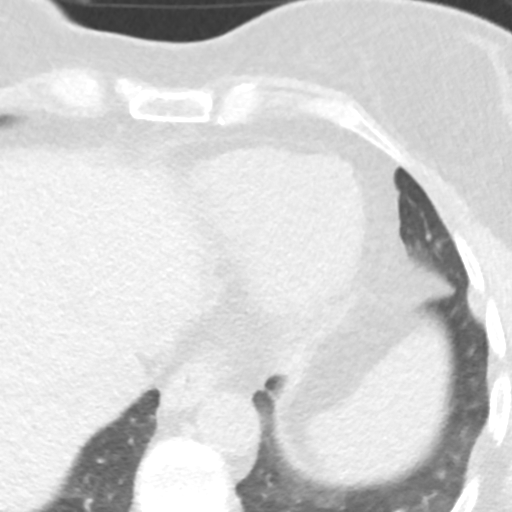
[im 17/42  vessel]
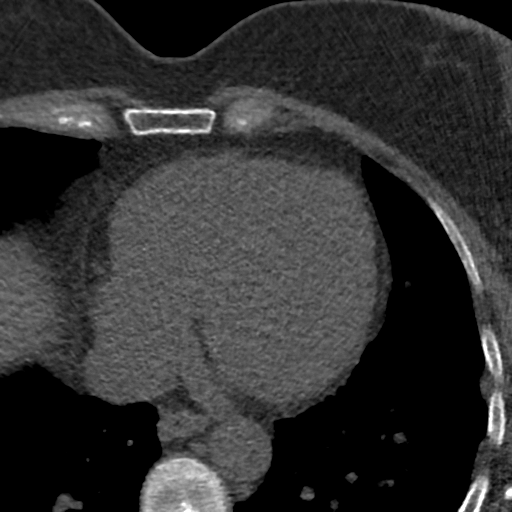
[im 25/42  vessel]
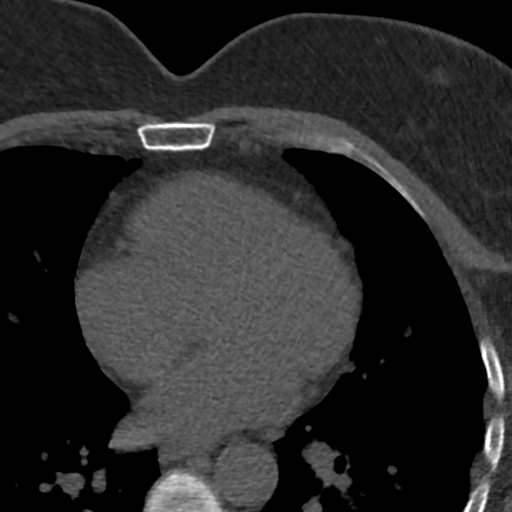
[im 33/42  vessel]
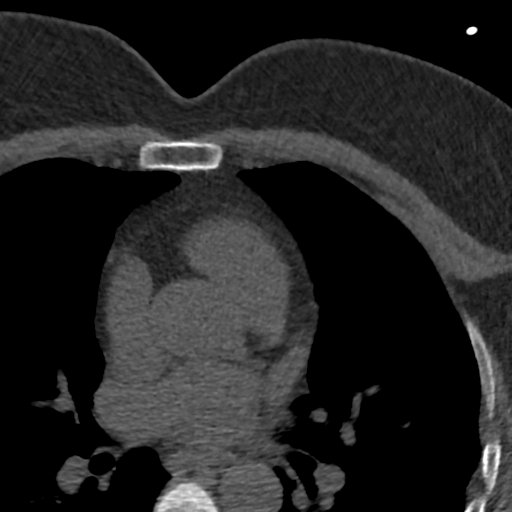

[Series 3: lung 71 % · axial · 0.68mm/px · z∈[-204,-120]mm · 5 of 42 slices shown]
[im 7/42  lung]
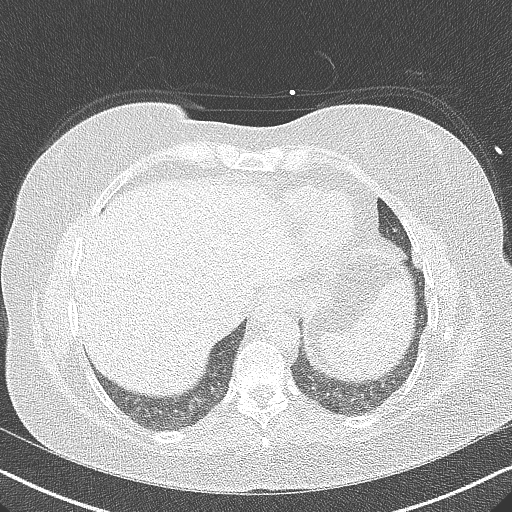
[im 14/42  lung]
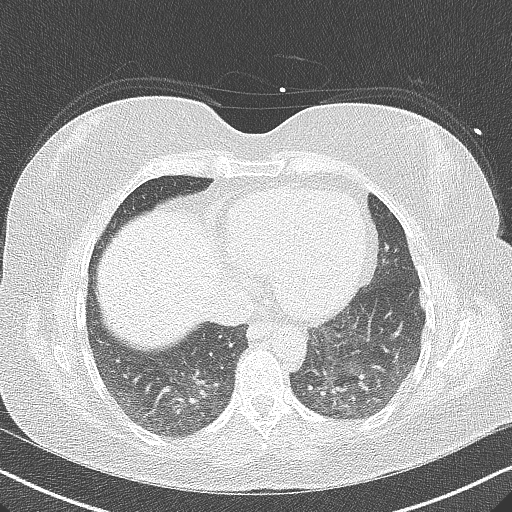
[im 21/42  lung]
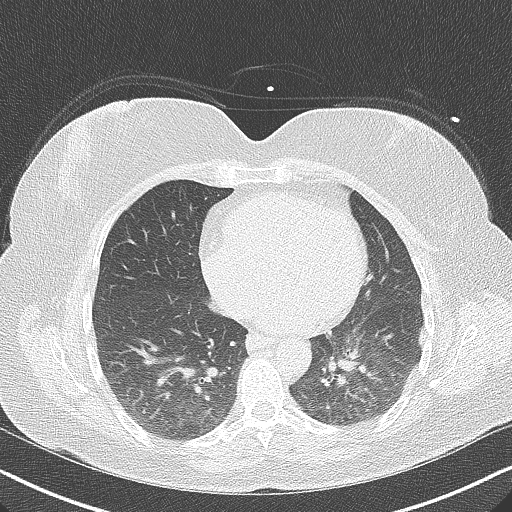
[im 28/42  lung]
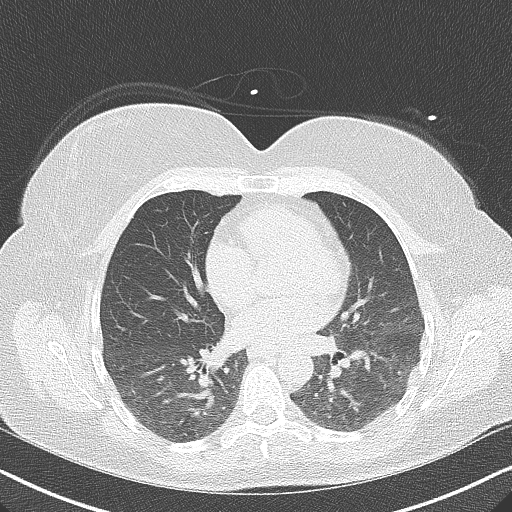
[im 35/42  lung]
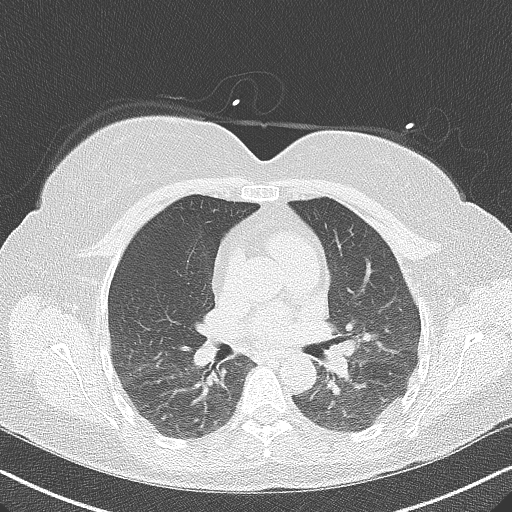

[Series 4: lung st 71 % · axial · 0.68mm/px · z∈[-204,-120]mm · 5 of 42 slices shown]
[im 7/42  lung]
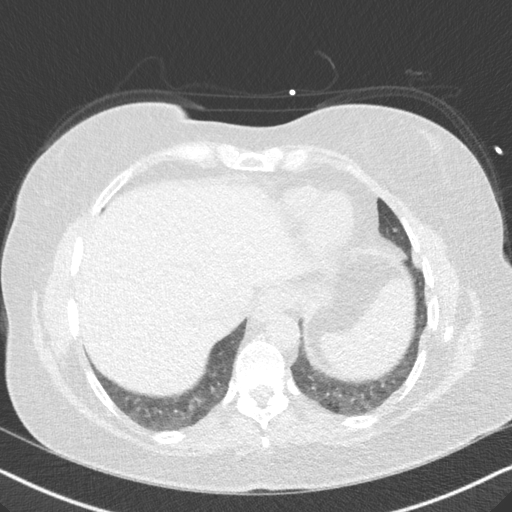
[im 14/42  lung]
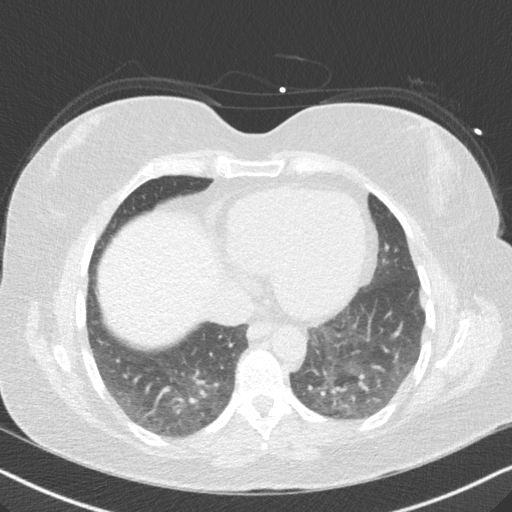
[im 21/42  lung]
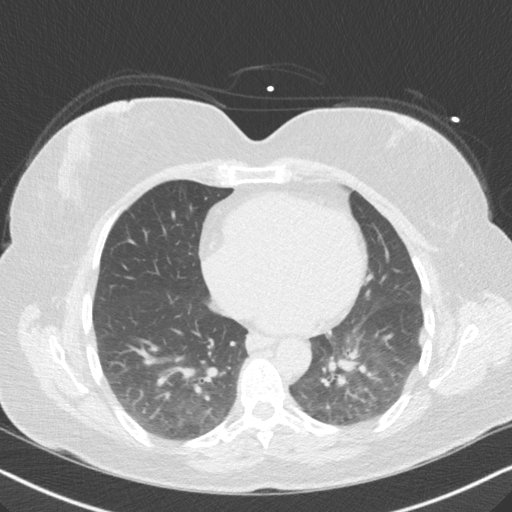
[im 28/42  lung]
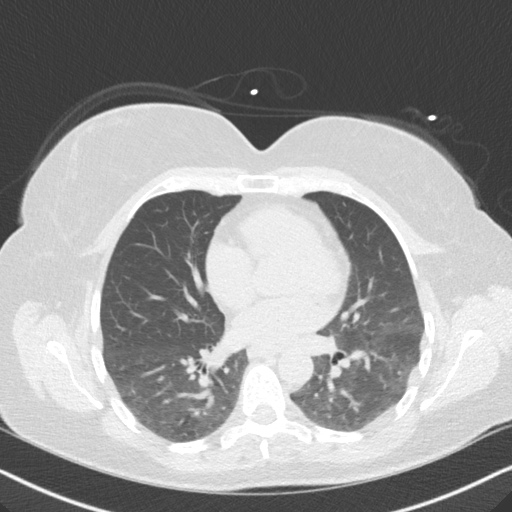
[im 35/42  lung]
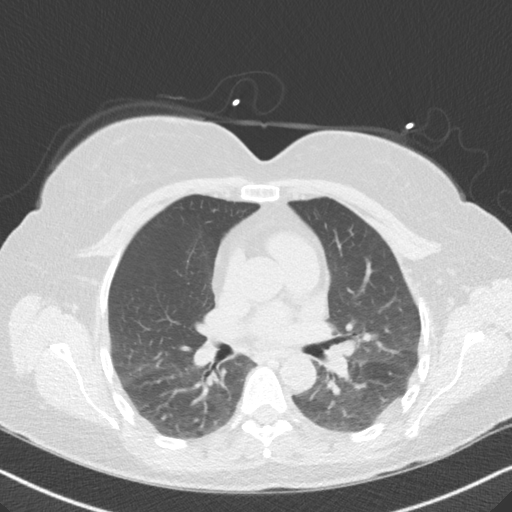

[14 of 20 positions shown; findings below may reference images not displayed]

FINDINGS: Vascular: Normal aortic caliber.

Mediastinum/Nodes: No imaged thoracic adenopathy.

Lungs/Pleura: No pleural fluid. Left lower lobe calcified granuloma.

Upper Abdomen: Normal imaged portions of the liver, spleen, stomach.

Musculoskeletal: No acute osseous abnormality.
IMPRESSION: No acute findings in the imaged extracardiac chest.
FINDINGS: Non-cardiac: See separate report from [REDACTED].

Ascending Aorta: Normal caliber

Pericardium: Normal

Coronary arteries: Normal origins
IMPRESSION: Coronary calcium score of 0. This is a low risk study.

*** End of Addendum ***
EXAM:
OVER-READ INTERPRETATION  CT CHEST

The following report is an over-read performed by radiologist Dr.
Nassera Sire [REDACTED] on 07/03/2020. This over-read
does not include interpretation of cardiac or coronary anatomy or
pathology. The calcium score interpretation by the cardiologist is
attached.
FINDINGS: Vascular: Normal aortic caliber.

Mediastinum/Nodes: No imaged thoracic adenopathy.

Lungs/Pleura: No pleural fluid. Left lower lobe calcified granuloma.

Upper Abdomen: Normal imaged portions of the liver, spleen, stomach.

Musculoskeletal: No acute osseous abnormality.
IMPRESSION: No acute findings in the imaged extracardiac chest.

## 2022-12-14 NOTE — Progress Notes (Unsigned)
MyChart Video Visit    Virtual Visit via Video Note   This patient is at least at moderate risk for complications without adequate follow up. This format is felt to be most appropriate for this patient at this time. Physical exam was limited by quality of the video and audio technology used for the visit. [name], [name] was able to get the patient set up on a video visit.  Patient location: Home Patient and provider in visit Provider location: Office  I discussed the limitations of evaluation and management by telemedicine and the availability of in person appointments. The patient expressed understanding and agreed to proceed.  Visit Date: 12/15/2022  Today's healthcare provider: Danise Edge, MD     Subjective:    Patient ID: Martha Rhodes, female    DOB: 06-26-1965, 57 y.o.   MRN: [id number]  No chief complaint on file.   HPI Discussed the use of AI scribe software for clinical note transcription with the patient, who gave verbal consent to proceed.  History of Present Illness   Patient is a 57 yo female in today for follow up on chronic medical concerns. No recent febrile illness or hospitalizations. Denies CP/palp/SOB/HA/congestion/fevers/GI or GU c/o. Taking meds as prescribed           Past Medical History:  Diagnosis Date  . Dyslipidemia 10/31/2020  . Insomnia 10/31/2020  . Psoriasis     Past Surgical History:  Procedure Laterality Date  . ADENOIDECTOMY    . APPENDECTOMY    . WISDOM TOOTH EXTRACTION      Family History  Problem Relation Age of Onset  . High blood pressure Mother   . Heart disease Mother   . Dementia Mother   . Dementia Father   . Heart disease Father   . Stroke Father   . High blood pressure Father   . Hyperlipidemia Father   . Cancer Maternal Aunt        colon cancer  . Colon cancer Maternal Aunt        60s  . Heart attack Maternal Aunt   . Heart disease Paternal Grandmother   . Stroke Paternal Grandfather   . Heart  disease Paternal Grandfather   . Esophageal cancer Neg Hx   . Rectal cancer Neg Hx   . Stomach cancer Neg Hx     Social History   Socioeconomic History  . Marital status: Married    Spouse name: Not on file  . Number of children: Not on file  . Years of education: Not on file  . Highest education level: Not on file  Occupational History  . Not on file  Tobacco Use  . Smoking status: Never  . Smokeless tobacco: Never  Substance and Sexual Activity  . Alcohol use: Not Currently    Comment: occasional glass of wine 1-2/month  . Drug use: No  . Sexual activity: Yes    Birth control/protection: Post-menopausal  Other Topics Concern  . Not on file  Social History Narrative   Lives with husband and youngest son. No dietary restrictions, wears seat belts and eats a heart healthy diet. Wears her seatbelt in the car.    Social Determinants of Health   Financial Resource Strain: Not on file  Food Insecurity: Not on file  Transportation Needs: Not on file  Physical Activity: Not on file  Stress: Not on file  Social Connections: Not on file  Intimate Partner Violence: Not on file    Outpatient Medications  Prior to Visit  Medication Sig Dispense Refill  . Cholecalciferol (VITAMIN D3) 50 MCG (2000 UT) CAPS Take by mouth.    . clobetasol (OLUX) 0.05 % topical foam Apply topically 2 (two) times daily. 50 g 2  . fluticasone (CUTIVATE) 0.05 % cream Apply topically 2 (two) times daily.    Marland Kitchen OVER THE COUNTER MEDICATION corticare b cap 2 tablets in the morning and 2 tablets in the eveneing    . OVER THE COUNTER MEDICATION compounded medication  PROGESTERONE SR (LACTOSE FREE)  Take one capsule by mouth daily at bedtime.    . rosuvastatin (CRESTOR) 20 MG tablet Take 1 tablet (20 mg total) by mouth daily. 90 tablet 3  . temazepam (RESTORIL) 7.5 MG capsule Take 7.5 mg by mouth at bedtime.    . TOPICALS DOCUMENTATION OPTIME COMPOUNDED MEDICATION  BIEST 5/5 (ESTRIOL/ESTRADIOL) AVB ( )   Apply 1 ml to inner arm or thigh and rub in once daily in the morning.    . TOPICALS DOCUMENTATION OPTIME COMPOUNDED MEDICATION  PROGESTERONE AVB ( ) CREAM  Apply 0.5 ml to inner thigh/arm each morning.     No facility-administered medications prior to visit.    No Known Allergies  Review of Systems  Constitutional:  Negative for fever and malaise/fatigue.  HENT:  Negative for congestion.   Eyes:  Negative for blurred vision.  Respiratory:  Negative for shortness of breath.   Cardiovascular:  Negative for chest pain, palpitations and leg swelling.  Gastrointestinal:  Negative for abdominal pain, blood in stool and nausea.  Genitourinary:  Negative for dysuria and frequency.  Musculoskeletal:  Negative for falls.  Skin:  Negative for rash.  Neurological:  Negative for dizziness, loss of consciousness and headaches.  Endo/Heme/Allergies:  Negative for environmental allergies.  Psychiatric/Behavioral:  Negative for depression. The patient is not nervous/anxious.        Objective:    Physical Exam Constitutional:      General: She is not in acute distress.    Appearance: Normal appearance. She is well-developed. She is not toxic-appearing.  HENT:     Head: Normocephalic and atraumatic.     Right Ear: External ear normal.     Left Ear: External ear normal.     Nose: Nose normal.  Eyes:     General:        Right eye: No discharge.        Left eye: No discharge.     Conjunctiva/sclera: Conjunctivae normal.  Neck:     Thyroid: No thyromegaly.  Cardiovascular:     Rate and Rhythm: Normal rate and regular rhythm.     Heart sounds: Normal heart sounds. No murmur heard. Pulmonary:     Effort: Pulmonary effort is normal. No respiratory distress.     Breath sounds: Normal breath sounds.  Abdominal:     General: Bowel sounds are normal.     Palpations: Abdomen is soft.     Tenderness: There is no abdominal tenderness. There is no guarding.  Musculoskeletal:        General:  Normal range of motion.     Cervical back: Neck supple.  Lymphadenopathy:     Cervical: No cervical adenopathy.  Skin:    General: Skin is warm and dry.  Neurological:     Mental Status: She is alert and oriented to person, place, and time.  Psychiatric:        Mood and Affect: Mood normal.        Behavior: Behavior normal.  Thought Content: Thought content normal.        Judgment: Judgment normal.   There were no vitals taken for this visit. Wt Readings from Last 3 Encounters:  04/28/22 176 lb 9.6 oz (80.1 kg)  04/22/22 176 lb (79.8 kg)  12/26/21 181 lb 4.8 oz (82.2 kg)       Assessment & Plan:  Vitamin D deficiency Assessment & Plan: Supplement and monitor       Assessment and Plan              I discussed the assessment and treatment plan with the patient. The patient was provided an opportunity to ask questions and all were answered. The patient agreed with the plan and demonstrated an understanding of the instructions.   The patient was advised to call back or seek an in-person evaluation if the symptoms worsen or if the condition fails to improve as anticipated.  Danise Edge, MD [facility] at [facility] [phone] (phone) [phone] (fax)  [facility]

## 2022-12-14 NOTE — Assessment & Plan Note (Signed)
Supplement and monitor 

## 2022-12-14 NOTE — Assessment & Plan Note (Signed)
Following with dermatology 

## 2022-12-15 ENCOUNTER — Encounter: Payer: Self-pay | Admitting: Family Medicine

## 2022-12-15 ENCOUNTER — Telehealth (INDEPENDENT_AMBULATORY_CARE_PROVIDER_SITE_OTHER): Payer: BC Managed Care – PPO | Admitting: Family Medicine

## 2022-12-15 DIAGNOSIS — E663 Overweight: Secondary | ICD-10-CM | POA: Diagnosis not present

## 2022-12-15 DIAGNOSIS — L409 Psoriasis, unspecified: Secondary | ICD-10-CM | POA: Diagnosis not present

## 2022-12-15 DIAGNOSIS — E559 Vitamin D deficiency, unspecified: Secondary | ICD-10-CM

## 2022-12-15 DIAGNOSIS — E669 Obesity, unspecified: Secondary | ICD-10-CM | POA: Diagnosis not present

## 2022-12-15 MED ORDER — ZEPBOUND 2.5 MG/0.5ML ~~LOC~~ SOAJ: 2.5000 mg | SUBCUTANEOUS | 1 refills | Status: DC

## 2022-12-15 NOTE — Assessment & Plan Note (Signed)
She is working hard to try and keep her weight down but it continues to climb. She walks briskly over a 5K daily, she uses a fit bit to track her movement. She watches her intake and is struggling despite all of her efforts. Start Zepbound 2.5 mg

## 2022-12-15 NOTE — Assessment & Plan Note (Deleted)
She is working hard to try and keep her weight down but it continues to climb. She walks briskly over a 5K daily, she uses a fit bit to track her movement. She watches her intake and is struggling despite all of her efforts.

## 2022-12-16 LAB — HM MAMMOGRAPHY

## 2022-12-17 ENCOUNTER — Other Ambulatory Visit (HOSPITAL_BASED_OUTPATIENT_CLINIC_OR_DEPARTMENT_OTHER): Payer: Self-pay | Admitting: Internal Medicine

## 2022-12-21 ENCOUNTER — Ambulatory Visit: Payer: BC Managed Care – PPO | Admitting: Family

## 2023-01-13 ENCOUNTER — Other Ambulatory Visit: Payer: Self-pay | Admitting: Family Medicine

## 2023-01-13 ENCOUNTER — Encounter: Payer: Self-pay | Admitting: Family Medicine

## 2023-01-13 MED ORDER — ZEPBOUND 5 MG/0.5ML ~~LOC~~ SOAJ: 5.0000 mg | SUBCUTANEOUS | 1 refills | Status: DC

## 2023-01-14 ENCOUNTER — Other Ambulatory Visit: Payer: Self-pay

## 2023-01-14 DIAGNOSIS — E785 Hyperlipidemia, unspecified: Secondary | ICD-10-CM

## 2023-01-14 DIAGNOSIS — Z0000 Encounter for general adult medical examination without abnormal findings: Secondary | ICD-10-CM

## 2023-01-14 DIAGNOSIS — E559 Vitamin D deficiency, unspecified: Secondary | ICD-10-CM

## 2023-01-14 DIAGNOSIS — G4700 Insomnia, unspecified: Secondary | ICD-10-CM

## 2023-03-08 ENCOUNTER — Other Ambulatory Visit (INDEPENDENT_AMBULATORY_CARE_PROVIDER_SITE_OTHER): Payer: BC Managed Care – PPO

## 2023-03-08 DIAGNOSIS — Z0000 Encounter for general adult medical examination without abnormal findings: Secondary | ICD-10-CM | POA: Diagnosis not present

## 2023-03-08 DIAGNOSIS — E559 Vitamin D deficiency, unspecified: Secondary | ICD-10-CM

## 2023-03-08 DIAGNOSIS — G4700 Insomnia, unspecified: Secondary | ICD-10-CM | POA: Diagnosis not present

## 2023-03-08 DIAGNOSIS — E785 Hyperlipidemia, unspecified: Secondary | ICD-10-CM

## 2023-03-08 LAB — COMPREHENSIVE METABOLIC PANEL
ALT: 17 U/L (ref 0–35)
AST: 18 U/L (ref 0–37)
Albumin: 4.5 g/dL (ref 3.5–5.2)
Alkaline Phosphatase: 49 U/L (ref 39–117)
BUN: 16 mg/dL (ref 6–23)
CO2: 25 meq/L (ref 19–32)
Calcium: 9.6 mg/dL (ref 8.4–10.5)
Chloride: 104 meq/L (ref 96–112)
Creatinine, Ser: 0.8 mg/dL (ref 0.40–1.20)
GFR: 80.85 mL/min (ref >60.00)
Glucose, Bld: 83 mg/dL (ref 70–99)
Potassium: 4.2 meq/L (ref 3.5–5.1)
Sodium: 139 meq/L (ref 135–145)
Total Bilirubin: 0.5 mg/dL (ref 0.2–1.2)
Total Protein: 6.5 g/dL (ref 6.0–8.3)

## 2023-03-08 LAB — CBC WITH DIFFERENTIAL/PLATELET
Basophils Absolute: 0 10*3/uL (ref 0.0–0.1)
Basophils Relative: 0.7 % (ref 0.0–3.0)
Eosinophils Absolute: 0.1 10*3/uL (ref 0.0–0.7)
Eosinophils Relative: 1.6 % (ref 0.0–5.0)
HCT: 41.3 % (ref 36.0–46.0)
Hemoglobin: 13.3 g/dL (ref 12.0–15.0)
Lymphocytes Relative: 22.6 % (ref 12.0–46.0)
Lymphs Abs: 1.2 10*3/uL (ref 0.7–4.0)
MCHC: 32.2 g/dL (ref 30.0–36.0)
MCV: 92.7 fL (ref 78.0–100.0)
Monocytes Absolute: 0.4 10*3/uL (ref 0.1–1.0)
Monocytes Relative: 7.6 % (ref 3.0–12.0)
Neutro Abs: 3.6 10*3/uL (ref 1.4–7.7)
Neutrophils Relative %: 67.5 % (ref 43.0–77.0)
Platelets: 226 10*3/uL (ref 150.0–400.0)
RBC: 4.45 Mil/uL (ref 3.87–5.11)
RDW: 13.1 % (ref 11.5–15.5)
WBC: 5.3 10*3/uL (ref 4.0–10.5)

## 2023-03-08 LAB — TSH: TSH: 2.85 u[IU]/mL (ref 0.35–5.50)

## 2023-03-08 LAB — VITAMIN D 25 HYDROXY (VIT D DEFICIENCY, FRACTURES): VITD: 50.17 ng/mL (ref 30.00–100.00)

## 2023-03-08 NOTE — Progress Notes (Signed)
Pt came in for labs. Did not want to duplicate labs that she may have done by cardiologist in December.  Pt agreeable to proceed with: Vit D, TSH, Comp and CBC today. Copy of orders given to pt to show cardiology to ensure no duplication of labs at their visit in December.

## 2023-03-16 ENCOUNTER — Encounter: Payer: BC Managed Care – PPO | Attending: Internal Medicine | Admitting: Dietician

## 2023-03-16 ENCOUNTER — Encounter: Payer: Self-pay | Admitting: Dietician

## 2023-03-16 VITALS — Wt 166.0 lb

## 2023-03-16 DIAGNOSIS — E669 Obesity, unspecified: Secondary | ICD-10-CM | POA: Insufficient documentation

## 2023-03-16 NOTE — Patient Instructions (Signed)
Goal: implement some resistance/strength training at least 2 times per week (can be as little as 15 minutes).  Other tips:  Aim to eat within 1-2 hours of waking up and every 3-5 hours following.   When snacking, aim to include a protein and complex carb.  At meals, aim to include 1/2 plate non-starchy vegetables, 1/4 plate protein, and 1/4 plate complex carbs.

## 2023-03-16 NOTE — Progress Notes (Signed)
Medical Nutrition Therapy  Appointment Start time:  1540  Appointment End time:  1635  Primary concerns today: weight gain post-menopause   Referral diagnosis: E66.9 Preferred learning style: no preference indicated Learning readiness: ready   NUTRITION ASSESSMENT   Anthropometrics   Wt: 166 lb  Clinical Medical Hx: reviewed Medications: zepbound Labs: reviewed Notable Signs/Symptoms: bad taste in mouth from zepbound Food Allergies: none reported  Lifestyle & Dietary Hx  Pt states she noticed her weight increase at around age 57 post-menopause and also during the covid-19 pandemic feels that she gained 10-15 lb. Pt reports prior to this her weight was consistent her whole life around 145 lb.   Pt states she has always been active. Pt reports she was running and weightlifting but suffered 2 injuries and felt that she needed to slow down on these things and has been focusing on brisk walking for 3-3.5 miles daily along with some isometric movements such as planking.   Pt reports she had children in her 30s and 50s and feels she is an older mom and wants to be around as long as possible for her family. She states she has heart disease running in her family with 2 grandparents and an aunt all passing in their 14s from heart attacks, so when she noticed a high cholesterol and LDL lab this caused her some stress.   Pt reports she started zepbound about 2 months ago and has been on 2.5 dose and noticed about 10 lb weight loss over 2 months. She states she is going up to 5 dose now. She reports symptoms of bad taste in her mouth, and states for 2 days after dose her appetite is mostly diminished.   Pt reports her eating habits have been fairly consistent throughout her life. She states she enjoys cooking, focuses on family meal times, and aims to reduce processed foods.   Estimated daily fluid intake: 64 oz Supplements: vitamin D3 Sleep: 7-8 hours, states sleep is great Stress /  self-care: moderate to high stress Current average weekly physical activity: walking daily 45 min  24-Hr Dietary Recall First Meal: egg on daves toast and mayo OR chobani yogurt smoothie (50cal) and 1/2 banana Snack: none Second Meal: low sodium [place] slices (5), and 2 provolone dipped in honey mustard Snack: none Third Meal: banza chickpea pasta and sauce with ground [place]  Snack: none Beverages: 64+ oz water, diet green tea, 1/2 cup coffee   NUTRITION DIAGNOSIS  [facility]-2.2 Altered nutrition-related laboratory As related to vitamin D deficiency.  As evidenced by vitamin d 25.8.   NUTRITION INTERVENTION  Nutrition education (E-1) on the following topics:   Plate Method Fruits & Vegetables: Aim to fill half your plate with a variety of fruits and vegetables. They are rich in vitamins, minerals, and fiber, and can help reduce the risk of chronic diseases. Choose a colorful assortment of fruits and vegetables to ensure you get a wide range of nutrients. Grains and Starches: Make at least half of your grain choices whole grains, such as brown rice, whole wheat bread, and oats. Whole grains provide fiber, which aids in digestion and healthy cholesterol levels. Aim for whole forms of starchy vegetables such as potatoes, sweet potatoes, beans, peas, and corn, which are fiber rich and provide many vitamins and minerals.  Protein: Incorporate lean sources of protein, such as poultry, fish, beans, nuts, and seeds, into your meals. Protein is essential for building and repairing tissues, staying full, balancing blood sugar, as well as  supporting immune function. Dairy: Include low-fat or fat-free dairy products like milk, yogurt, and cheese in your diet. Dairy foods are excellent sources of calcium and vitamin D, which are crucial for bone health.   Resistance Training: Resistance training is especially important for postmenopausal women for several reasons: 1. Muscle Mass Preservation: After  menopause, women experience a natural decline in estrogen, which can lead to muscle loss (sarcopenia). Resistance training helps maintain and build muscle mass, counteracting this decline.  2. Bone Health: Postmenopausal women are at increased risk for osteoporosis due to reduced estrogen levels. Resistance training increases bone density and strength, reducing the risk of fractures.  3. Metabolism Boost: Muscle tissue burns more calories than fat, even at rest. By building and maintaining muscle through resistance training, women can help manage their weight and improve metabolic health.  4. Chronic Disease Prevention: Regular strength training can reduce the risk of chronic diseases such as heart disease and diabetes by improving insulin sensitivity and cardiovascular health.  Incorporating resistance training into a fitness routine post-menopause is vital for overall health and quality of life. Aim for at least two days a week, focusing on all major muscle groups, to reap these benefits.  Handouts Provided Include  Plate Method  Learning Style & Readiness for Change Teaching method utilized: Visual & Auditory  Demonstrated degree of understanding via: Teach Back  Barriers to learning/adherence to lifestyle change: none  Goals Established by Pt  Goal: implement some resistance/strength training at least 2 times per week (can be as little as 15 minutes).  Other tips:  Aim to eat within 1-2 hours of waking up and every 3-5 hours following.   When snacking, aim to include a protein and complex carb.  At meals, aim to include 1/2 plate non-starchy vegetables, 1/4 plate protein, and 1/4 plate complex carbs.    MONITORING & EVALUATION Dietary intake, weekly physical activity, and follow up as needed.  Next Steps  Patient is to call for questions.

## 2023-03-22 ENCOUNTER — Telehealth: Payer: BC Managed Care – PPO | Admitting: Family Medicine

## 2023-03-23 ENCOUNTER — Encounter: Payer: Self-pay | Admitting: Family Medicine

## 2023-03-23 ENCOUNTER — Other Ambulatory Visit: Payer: Self-pay | Admitting: Family

## 2023-03-23 ENCOUNTER — Other Ambulatory Visit: Payer: Self-pay | Admitting: *Deleted

## 2023-03-23 DIAGNOSIS — E7800 Pure hypercholesterolemia, unspecified: Secondary | ICD-10-CM

## 2023-03-23 MED ORDER — ZEPBOUND 5 MG/0.5ML ~~LOC~~ SOAJ: 5.0000 mg | SUBCUTANEOUS | 1 refills | Status: DC

## 2023-04-13 ENCOUNTER — Telehealth: Payer: BC Managed Care – PPO | Admitting: Family Medicine

## 2023-04-13 VITALS — Wt 158.0 lb

## 2023-04-13 DIAGNOSIS — E559 Vitamin D deficiency, unspecified: Secondary | ICD-10-CM

## 2023-04-14 ENCOUNTER — Encounter: Payer: Self-pay | Admitting: Family Medicine

## 2023-04-14 NOTE — Progress Notes (Signed)
MyChart Video Visit    Virtual Visit via Video Note   This patient is at least at moderate risk for complications without adequate follow up. This format is felt to be most appropriate for this patient at this time. Physical exam was limited by quality of the video and audio technology used for the visit. Juanetta, [name] was able to get the patient set up on a video visit.  Patient location: home Patient and provider in visit Provider location: Office  I discussed the limitations of evaluation and management by telemedicine and the availability of in person appointments. The patient expressed understanding and agreed to proceed.  Visit Date: 04/13/2023  Today's healthcare provider: Danise Edge, MD     Subjective:    Patient ID: Martha Rhodes, female    DOB: 11-27-1965, 57 y.o.   MRN: [id number]  Chief Complaint  Patient presents with   Follow-up    HPI Discussed the use of AI scribe software for clinical note transcription with the patient, who gave verbal consent to proceed.  History of Present Illness   The patient, with a history of being overweight, has been on a weight loss medication for some time. They report a significant decrease in weight, which they are pleased with. However, they have noticed changes in their sense of taste and smell, which they attribute to the medication. The changes have made food less enjoyable, but the patient does not wish to discontinue the medication due to the positive impact on their weight. The patient is considering increasing the dosage of the medication as they feel their weight loss has plateaued. They plan to monitor their weight over the next few weeks before making a decision. The patient also mentions an upcoming cardiology appointment.        Past Medical History:  Diagnosis Date   Dyslipidemia 10/31/2020   Insomnia 10/31/2020   Psoriasis     Past Surgical History:  Procedure Laterality Date   ADENOIDECTOMY      APPENDECTOMY     WISDOM TOOTH EXTRACTION      Family History  Problem Relation Age of Onset   High blood pressure Mother    Heart disease Mother    Dementia Mother    Dementia Father    Heart disease Father    Stroke Father    High blood pressure Father    Hyperlipidemia Father    Cancer Maternal Aunt        colon cancer   Colon cancer Maternal Aunt        60s   Heart attack Maternal Aunt    Heart disease Paternal Grandmother    Stroke Paternal Grandfather    Heart disease Paternal Grandfather    Esophageal cancer Neg Hx    Rectal cancer Neg Hx    Stomach cancer Neg Hx     Social History   Socioeconomic History   Marital status: Married    Spouse name: Not on file   Number of children: Not on file   Years of education: Not on file   Highest education level: Not on file  Occupational History   Not on file  Tobacco Use   Smoking status: Never   Smokeless tobacco: Never  Substance and Sexual Activity   Alcohol use: Not Currently    Comment: occasional glass of wine 1-2/month   Drug use: No   Sexual activity: Yes    Birth control/protection: Post-menopausal  Other Topics Concern   Not on file  Social History Narrative   Lives with husband and youngest son. No dietary restrictions, wears seat belts and eats a heart healthy diet. Wears her seatbelt in the car.    Social Determinants of Health   Financial Resource Strain: Not on file  Food Insecurity: No Food Insecurity (03/16/2023)   Hunger Vital Sign    Worried About Running Out of Food in the Last Year: Never true    Ran Out of Food in the Last Year: Never true  Transportation Needs: Not on file  Physical Activity: Not on file  Stress: Not on file  Social Connections: Not on file  Intimate Partner Violence: Not on file    Outpatient Medications Prior to Visit  Medication Sig Dispense Refill   Cholecalciferol (VITAMIN D3) 50 MCG (2000 UT) CAPS Take by mouth.     clobetasol (OLUX) 0.05 % topical foam  Apply topically 2 (two) times daily. 50 g 2   fluticasone (CUTIVATE) 0.05 % cream Apply topically 2 (two) times daily.     OVER THE COUNTER MEDICATION corticare b cap 2 tablets in the morning and 2 tablets in the eveneing     OVER THE COUNTER MEDICATION compounded medication  PROGESTERONE SR (LACTOSE FREE)  Take one capsule by mouth daily at bedtime.     rosuvastatin (CRESTOR) 20 MG tablet TAKE 1 TABLET BY MOUTH DAILY 30 tablet 4   temazepam (RESTORIL) 7.5 MG capsule Take 7.5 mg by mouth at bedtime.     tirzepatide (ZEPBOUND) 5 MG/0.5ML Pen Inject 5 mg into the skin once a week. 2 mL 1   TOPICALS DOCUMENTATION OPTIME COMPOUNDED MEDICATION  BIEST 5/5 (ESTRIOL/ESTRADIOL) AVB ( )  Apply 1 ml to inner arm or thigh and rub in once daily in the morning.     No facility-administered medications prior to visit.    No Known Allergies  Review of Systems  Constitutional:  Negative for fever and malaise/fatigue.  HENT:  Negative for congestion.   Eyes:  Negative for blurred vision.  Respiratory:  Negative for shortness of breath.   Cardiovascular:  Negative for chest pain, palpitations and leg swelling.  Gastrointestinal:  Negative for abdominal pain, blood in stool and nausea.  Genitourinary:  Negative for dysuria and frequency.  Musculoskeletal:  Negative for falls.  Skin:  Negative for rash.  Neurological:  Negative for dizziness, loss of consciousness and headaches.  Endo/Heme/Allergies:  Negative for environmental allergies.  Psychiatric/Behavioral:  Negative for depression. The patient is not nervous/anxious.        Objective:    Physical Exam Constitutional:      General: She is not in acute distress.    Appearance: Normal appearance. She is not ill-appearing or toxic-appearing.  HENT:     Head: Normocephalic and atraumatic.     Right Ear: External ear normal.     Left Ear: External ear normal.     Nose: Nose normal.  Eyes:     General:        Right eye: No discharge.         Left eye: No discharge.  Pulmonary:     Effort: Pulmonary effort is normal.  Skin:    Findings: No rash.  Neurological:     Mental Status: She is alert and oriented to person, place, and time.  Psychiatric:        Behavior: Behavior normal.     Wt 158 lb (71.7 kg)   BMI 26.29 kg/m  Wt Readings from Last 3 Encounters:  04/13/23  158 lb (71.7 kg)  03/16/23 166 lb (75.3 kg)  04/28/22 176 lb 9.6 oz (80.1 kg)       Assessment & Plan:  Vitamin D deficiency Assessment & Plan: Supplement and monitor       Assessment and Plan    Weight Management Successful weight loss with medication, experiencing altered taste and smell as a potential side effect. Patient is considering increasing the dose from 5mg  to 7.5mg  due to a perceived slowing of weight loss. -Continue current medication regimen. -Consider increasing dose to 7.5mg  if weight loss continues to slow, patient to contact office if decision is made. -Plan to discuss long-term weight management strategies at next appointment in February.  General Health Maintenance Upcoming cardiology appointment with planned blood work. -Review cardiology lab results when available. -Continue healthy lifestyle habits including hydration, diet, and exercise. -Follow-up appointment scheduled for February 2025.         I discussed the assessment and treatment plan with the patient. The patient was provided an opportunity to ask questions and all were answered. The patient agreed with the plan and demonstrated an understanding of the instructions.   The patient was advised to call back or seek an in-person evaluation if the symptoms worsen or if the condition fails to improve as anticipated.  Danise Edge, MD [facility] at [facility] [phone] (phone) [phone] (fax)  [facility]

## 2023-04-14 NOTE — Assessment & Plan Note (Signed)
Supplement and monitor 

## 2023-04-27 NOTE — Progress Notes (Deleted)
  Cardiology Office Note:  .   Date:  04/27/2023  ID:  Martha Rhodes, DOB [birth year], MRN [id number] PCP: Bradd Canary, MD  [facility] Providers Cardiologist:  None { Click to update primary MD,subspecialty MD or APP then REFRESH:1}   Patient Profile: .      PMH Vitamin D deficiency Dyslipidemia Coronary calcium score of 0 in 2022  Referred to Advanced Lipid disorder clinic and seen by Dr. Rennis Golden 06/19/2020. She is a professor at [employer].  She reported longstanding history of elevated cholesterol even when she was much younger and more active.  She remained physically active and eats very healthy although some recent concern about weight gain.  Lipid panel at that time revealed total cholesterol 283, HDL 85, LDL 182 and triglycerides 97.  She did not have any additional risk factors of diabetes, hypertension, tobacco abuse, or family history of early onset heart disease.  She was taking supplemental estrogen and progesterone at low doses.  She underwent CT calcium score February 2022 with calcium score of 0. Genetic testing completed 07/2020 did not reveal genetic variants.   At follow-up visit 12/26/2021 lipid panel revealed LDL particle #2018, increased from 1570, LDL-C of 209 up from 178, and marked increase in small LDL particle number which was previously undetectable now up to 406.  She had learned that both of her parents have high cholesterol and that her mother was on atorvastatin 40 mg and her father on 80 mg.  Findings suspicious for genetic dyslipidemia.  She was advised to start rosuvastatin 20 mg daily and return for NMR in 3 months.  Telehealth visit on 04/22/2022 due to her diagnosis of COVID-19 infection for which she was treated with Paxlovid.  Recent lipid panel revealed improvement and LDL particle number down to 1064, LDL-C 85 down from 209 and total cholesterol 172 down from 299.  There was a small increase in small LDL particle number however overall LDL particle numbers  are much lower.  She was tolerating rosuvastatin without side effects.       History of Present Illness: .   Martha Rhodes is a *** 57 y.o. female who is here today for follow-up of dyslipidemia.    Discussed the use of AI scribe software for clinical note transcription with the patient, who gave verbal consent to proceed.   ROS: ***       Studies Reviewed: .        *** Risk Assessment/Calculations:   {Does this patient have ATRIAL FIBRILLATION?[phone]} No BP recorded.  {Refresh Note OR Click here to enter BP  :1}***       Physical Exam:   VS:  There were no vitals taken for this visit.   Wt Readings from Last 3 Encounters:  04/13/23 158 lb (71.7 kg)  03/16/23 166 lb (75.3 kg)  04/28/22 176 lb 9.6 oz (80.1 kg)    GEN: Well nourished, well developed in no acute distress NECK: No JVD; No carotid bruits CARDIAC: ***RRR, no murmurs, rubs, gallops RESPIRATORY:  Clear to auscultation without rales, wheezing or rhonchi  ABDOMEN: Soft, non-tender, non-distended EXTREMITIES:  No edema; No deformity     ASSESSMENT AND PLAN: .    Dyslipidemia:     {Are you ordering a CV Procedure (e.g. stress test, cath, DCCV, TEE, etc)?   Press F2        :[id number]}  Dispo: ***  Signed, Eligha Bridegroom, NP-C

## 2023-04-28 LAB — NMR, LIPOPROFILE
Cholesterol, Total: 164 mg/dL (ref 100–199)
HDL Particle Number: 36.8 umol/L (ref >=30.5)
HDL-C: 71 mg/dL (ref >39)
LDL Particle Number: 681 nmol/L (ref <1000)
LDL Size: 21.1 nmol (ref >20.5)
LDL-C (NIH Calc): 80 mg/dL (ref 0–99)
LP-IR Score: 25 (ref <=45)
Small LDL Particle Number: <90 nmol/L (ref <=527)
Triglycerides: 65 mg/dL (ref 0–149)

## 2023-04-30 ENCOUNTER — Encounter (HOSPITAL_BASED_OUTPATIENT_CLINIC_OR_DEPARTMENT_OTHER): Payer: Self-pay | Admitting: Nurse Practitioner

## 2023-05-05 ENCOUNTER — Encounter (HOSPITAL_BASED_OUTPATIENT_CLINIC_OR_DEPARTMENT_OTHER): Payer: BC Managed Care – PPO | Admitting: Nurse Practitioner

## 2023-05-20 ENCOUNTER — Other Ambulatory Visit: Payer: Self-pay | Admitting: Family

## 2023-05-20 ENCOUNTER — Encounter: Payer: Self-pay | Admitting: Family Medicine

## 2023-05-20 MED ORDER — ZEPBOUND 5 MG/0.5ML ~~LOC~~ SOAJ: 5.0000 mg | SUBCUTANEOUS | 1 refills | Status: DC

## 2023-05-26 ENCOUNTER — Other Ambulatory Visit: Payer: Self-pay

## 2023-05-26 MED ORDER — ROSUVASTATIN CALCIUM 20 MG PO TABS: 20.0000 mg | ORAL_TABLET | Freq: Every day | ORAL | 1 refills | Status: DC

## 2023-06-23 ENCOUNTER — Ambulatory Visit (HOSPITAL_BASED_OUTPATIENT_CLINIC_OR_DEPARTMENT_OTHER): Payer: 59 | Admitting: Nurse Practitioner

## 2023-06-23 ENCOUNTER — Encounter (HOSPITAL_BASED_OUTPATIENT_CLINIC_OR_DEPARTMENT_OTHER): Payer: Self-pay | Admitting: Nurse Practitioner

## 2023-06-23 VITALS — BP 90/60 | HR 85 | Ht 65.0 in | Wt 153.0 lb

## 2023-06-23 DIAGNOSIS — E7800 Pure hypercholesterolemia, unspecified: Secondary | ICD-10-CM

## 2023-06-23 DIAGNOSIS — Z7189 Other specified counseling: Secondary | ICD-10-CM | POA: Diagnosis not present

## 2023-06-23 NOTE — Progress Notes (Signed)
 Cardiology Office Note:  .   Date:  06/23/2023  ID:  Martha Rhodes, DOB 07-23-1965, MRN [id number] PCP: Martha Harlene LABOR, MD  [facility] Providers Cardiologist:  None    Patient Profile: .      PMH Dyslipidemia Coronary calcium  score of 0 on CT 2022  Referred to Advanced Lipid disorder clinic and seen by Martha Rhodes 06/19/2020.  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  Reports a longstanding history of elevated cholesterol even when she was much younger and more active.  At the time of the visit, she remained active and eats a healthy diet although she was concerned about recent weight gain.  Her most recent lipid panel at that time revealed total cholesterol 283, HDL 85, LDL 182, and triglycerides 97.  She has no known coronary disease or prior cardiac events.  No history of diabetes, hypertension, tobacco abuse, or significant family history of early onset heart disease.  She was taking supplemental estrogen and progesterone at low doses.  Her CT calcium  score February 2022 was 0.  Genetic testing 07/2020 revealed although LDL cholesterol and particles were elevated, LDL size was favorable with almost nonexistent small LDL particles, negative LP(a).  She was encouraged to manage cholesterol with lifestyle modification.  She returned for office visit 12/26/2021 with Martha Rhodes.  Unfortunately cholesterol had worsened.  Recent labs show LDL particle number of 2018, increased from 1570, LDL-C of 209 (up from 178) and a marked increase in small LDL particle number which was previously undetectable now up to 406.  She reported history of both parents having high cholesterol and are on high intensity statin.  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  She was advised to start rosuvastatin  20 mg daily and return for NMR lipid test in 3 months.  NMR 04/13/2022 revealed LDL particle number 1064, down from 2018, LDL-C of 85, down from 209 and total cholesterol 172 (down from 299).  There was a small  increase in small LDL particle number, however her overall LDL particle numbers are much lower.  She was tolerating rosuvastatin  without concerning symptoms.       History of Present Illness: .   Martha Rhodes is a very pleasant 58 y.o. female who returns today for follow-up of dyslipidemia.  Lipid testing from 04/27/2023 reveals LDL particle number 681, LDL-C 80, HDL-C 71, triglycerides 65, total cholesterol 164, and small LDL particle number < 90. She is happy with the significant improvement in her lipid profile and is tolerating rosuvastatin  without concerning side effects. She is engaging in regular exercise, transitioning from running to brisk walking due to concerns about joint health. She plans to incorporate weightlifting into her routine in the future for bone health. She denies any new or different medical issues since her last visit.  She denies chest pain, potation's, shortness of breath, orthopnea, PND, edema, presyncope, syncope.  Discussed the use of AI scribe software for clinical note transcription with the patient, who gave verbal consent to proceed.   ROS: See HPI       Studies Reviewed: SABRA   EKG Interpretation Date/Time:  Wednesday June 23 2023 15:32:17 EST Ventricular Rate:  68 PR Interval:  114 QRS Duration:  80 QT Interval:  378 QTC Calculation: 401 R Axis:   81  Text Interpretation: Normal sinus rhythm Normal ECG No previous ECGs available No ST abnormality Confirmed by Percy Browning [id number]) on 06/23/2023 3:59:18 PM     Risk Assessment/Calculations:  Physical Exam:   VS:  BP 90/60   Pulse 85   Ht 5' 5 (1.651 m)   Wt 153 lb (69.4 kg)   SpO2 98%   BMI 25.46 kg/m    Wt Readings from Last 3 Encounters:  06/23/23 153 lb (69.4 kg)  04/13/23 158 lb (71.7 kg)  03/16/23 166 lb (75.3 kg)    GEN: Well nourished, well developed in no acute distress NECK: No JVD; No carotid bruits CARDIAC: RRR, no murmurs, rubs, gallops RESPIRATORY:  Clear to  auscultation without rales, wheezing or rhonchi  ABDOMEN: Soft, non-tender, non-distended EXTREMITIES:  No edema; No deformity     ASSESSMENT AND PLAN: .    Dyslipidemia: Lipid profile 04/27/2023 reveals total cholesterol 164, HDL 71, LDL 80, and triglycerides 65. She is tolerating rosuvastatin  20 mg daily without concerning side effects.  She generally eats a healthy diet and exercises regularly. She has been on Zepbound  to aid with weight loss. We will continue rosuvastatin  20 mg daily. Return in 1 year with NMR prior.  Coronary calcium  score of 0: She remains active with regular exercise and is not having any chest pain, dyspnea, or other symptoms concerning for angina.  EKG today reveals NSR, no ST abnormality.   No indication for further ischemia evaluation at this time. Advised no indication to repeat calcium  score for at least 7 years.        Disposition:1 year with Martha Rhodes or me  Signed, Rosaline Bane, NP-C

## 2023-06-23 NOTE — Patient Instructions (Signed)
Medication Instructions:   Your physician recommends that you continue on your current medications as directed. Please refer to the Current Medication list given to you today.   *If you need a refill on your cardiac medications before your next appointment, please call your pharmacy*   Lab Work:  1 year fasting lab work prior to your yearly appointment. Will add to appt notes for fasting lab and contact you in the year.   If you have labs (blood work) drawn today and your tests are completely normal, you will receive your results only by: MyChart Message (if you have MyChart) OR A paper copy in the mail If you have any lab test that is abnormal or we need to change your treatment, we will call you to review the results.   Testing/Procedures:  None ordered.   Follow-Up: At [facility], you and your health needs are our priority.  As part of our continuing mission to provide you with exceptional heart care, we have created designated Provider Care Teams.  These Care Teams include your primary Cardiologist (physician) and Advanced Practice Providers (APPs -  Physician Assistants and Nurse Practitioners) who all work together to provide you with the care you need, when you need it.  We recommend signing up for the patient portal called "MyChart".  Sign up information is provided on this After Visit Summary.  MyChart is used to connect with patients for Virtual Visits (Telemedicine).  Patients are able to view lab/test results, encounter notes, upcoming appointments, etc.  Non-urgent messages can be sent to your provider as well.   To learn more about what you can do with MyChart, go to [email or web address].    Your next appointment:   1 year(s)  Provider:   K. Italy Hilty, MD or Eligha Bridegroom, NP    Other Instructions  Your physician wants you to follow-up in: 1 year.  You will receive a reminder letter in the mail two months in advance. If you don't receive a letter,  please call our office to schedule the follow-up appointment.

## 2023-07-04 NOTE — Assessment & Plan Note (Deleted)
 Supplement and monitor

## 2023-07-04 NOTE — Assessment & Plan Note (Deleted)
 Encouraged good sleep hygiene such as dark, quiet room. No blue/green glowing lights such as computer screens in bedroom. No alcohol or stimulants in evening. Cut down on caffeine as able. Regular exercise is helpful but not just prior to bed time.

## 2023-07-04 NOTE — Assessment & Plan Note (Deleted)
 Using Olux

## 2023-07-04 NOTE — Assessment & Plan Note (Deleted)
 She is working hard to try and keep her weight down but it continues to climb. She walks briskly over a 5K daily, she uses a fit bit to track her movement. She watches her intake and is struggling despite all of her efforts. Zepbound 2.5 mg

## 2023-07-04 NOTE — Assessment & Plan Note (Deleted)
 Encourage heart healthy diet such as MIND or DASH diet, increase exercise, avoid trans fats, simple carbohydrates and processed foods, consider a krill or fish or flaxseed oil cap daily.

## 2023-07-05 ENCOUNTER — Encounter: Payer: BC Managed Care – PPO | Admitting: Family Medicine

## 2023-07-05 DIAGNOSIS — E669 Obesity, unspecified: Secondary | ICD-10-CM

## 2023-07-05 DIAGNOSIS — E785 Hyperlipidemia, unspecified: Secondary | ICD-10-CM

## 2023-07-05 DIAGNOSIS — L409 Psoriasis, unspecified: Secondary | ICD-10-CM

## 2023-07-05 DIAGNOSIS — G4700 Insomnia, unspecified: Secondary | ICD-10-CM

## 2023-07-05 DIAGNOSIS — E559 Vitamin D deficiency, unspecified: Secondary | ICD-10-CM

## 2023-07-16 ENCOUNTER — Encounter: Payer: Self-pay | Admitting: Family Medicine

## 2023-07-19 ENCOUNTER — Other Ambulatory Visit: Payer: Self-pay | Admitting: Family Medicine

## 2023-07-19 ENCOUNTER — Other Ambulatory Visit: Payer: Self-pay | Admitting: Emergency Medicine

## 2023-07-19 MED ORDER — ZEPBOUND 5 MG/0.5ML ~~LOC~~ SOAJ: 5.0000 mg | SUBCUTANEOUS | 2 refills | Status: DC

## 2023-07-21 ENCOUNTER — Other Ambulatory Visit: Payer: Self-pay | Admitting: Internal Medicine

## 2023-08-06 ENCOUNTER — Other Ambulatory Visit: Payer: Self-pay | Admitting: Family

## 2023-08-06 MED ORDER — TIRZEPATIDE-WEIGHT MANAGEMENT 7.5 MG/0.5ML ~~LOC~~ SOLN: 7.5000 mg | SUBCUTANEOUS | 1 refills | Status: DC

## 2023-08-09 ENCOUNTER — Other Ambulatory Visit: Payer: Self-pay | Admitting: Family

## 2023-08-09 MED ORDER — ZEPBOUND 7.5 MG/0.5ML ~~LOC~~ SOAJ
7.5000 mg | SUBCUTANEOUS | 1 refills | Status: DC
Start: 2023-08-09 — End: 2023-10-12

## 2023-08-11 NOTE — Assessment & Plan Note (Signed)
 Encouraged good sleep hygiene such as dark, quiet room. No blue/green glowing lights such as computer screens in bedroom. No alcohol or stimulants in evening. Cut down on caffeine as able. Regular exercise is helpful but not just prior to bed time.

## 2023-08-11 NOTE — Assessment & Plan Note (Signed)
 Supplement and monitor

## 2023-08-11 NOTE — Assessment & Plan Note (Addendum)
 Encourage heart healthy diet such as MIND or DASH diet, increase exercise, avoid trans fats, simple carbohydrates and processed foods, consider a krill or fish or flaxseed oil cap daily.  Tolerating Rosuvastatin and follows with cardiology

## 2023-08-11 NOTE — Assessment & Plan Note (Addendum)
 She is working hard to try and keep her weight down but it continues to climb. She walks briskly over a 5K daily, she uses a fit bit to track her movement. She watches her intake and is struggling despite all of her efforts. Will increase Zepbound to 7.5 mg weekly

## 2023-08-12 ENCOUNTER — Telehealth (INDEPENDENT_AMBULATORY_CARE_PROVIDER_SITE_OTHER): Payer: 59 | Admitting: Family Medicine

## 2023-08-12 VITALS — Wt 143.0 lb

## 2023-08-12 DIAGNOSIS — E669 Obesity, unspecified: Secondary | ICD-10-CM | POA: Diagnosis not present

## 2023-08-12 DIAGNOSIS — E559 Vitamin D deficiency, unspecified: Secondary | ICD-10-CM | POA: Diagnosis not present

## 2023-08-12 DIAGNOSIS — G4700 Insomnia, unspecified: Secondary | ICD-10-CM

## 2023-08-12 DIAGNOSIS — E785 Hyperlipidemia, unspecified: Secondary | ICD-10-CM | POA: Diagnosis not present

## 2023-08-14 ENCOUNTER — Encounter: Payer: Self-pay | Admitting: Family Medicine

## 2023-08-14 NOTE — Progress Notes (Signed)
 Subjective:    Patient ID: Martha Rhodes, female    DOB: 06/24/1965, 58 y.o.   MRN: [id number]  Chief Complaint  Patient presents with   Follow-up    HPI Patient is in today for follow up on weight loss. She is feeling well and has lost a significant amount of weight on the Zepbound which she pays for out of pocket but has stopped losing so would like to increase dosing again. She tolerates it well without GI disturbance. She also exercises regularly and hydrates well.   Past Medical History:  Diagnosis Date   Dyslipidemia 10/31/2020   Insomnia 10/31/2020   Psoriasis     Past Surgical History:  Procedure Laterality Date   ADENOIDECTOMY     APPENDECTOMY     WISDOM TOOTH EXTRACTION      Family History  Problem Relation Age of Onset   High blood pressure Mother    Heart disease Mother    Dementia Mother    Dementia Father    Heart disease Father    Stroke Father    High blood pressure Father    Hyperlipidemia Father    Cancer Maternal Aunt        colon cancer   Colon cancer Maternal Aunt        60s   Heart attack Maternal Aunt    Heart disease Paternal Grandmother    Stroke Paternal Grandfather    Heart disease Paternal Grandfather    Esophageal cancer Neg Hx    Rectal cancer Neg Hx    Stomach cancer Neg Hx     Social History   Socioeconomic History   Marital status: Married    Spouse name: Not on file   Number of children: Not on file   Years of education: Not on file   Highest education level: Not on file  Occupational History   Not on file  Tobacco Use   Smoking status: Never   Smokeless tobacco: Never  Substance and Sexual Activity   Alcohol use: Not Currently    Comment: occasional glass of wine 1-2/month   Drug use: No   Sexual activity: Yes    Birth control/protection: Post-menopausal  Other Topics Concern   Not on file  Social History Narrative   Lives with husband and youngest son. No dietary restrictions, wears seat belts and eats a  heart healthy diet. Wears her seatbelt in the car.    Social Drivers of Corporate investment banker Strain: Not on file  Food Insecurity: No Food Insecurity (03/16/2023)   Hunger Vital Sign    Worried About Running Out of Food in the Last Year: Never true    Ran Out of Food in the Last Year: Never true  Transportation Needs: Not on file  Physical Activity: Not on file  Stress: Not on file  Social Connections: Not on file  Intimate Partner Violence: Not on file    Outpatient Medications Prior to Visit  Medication Sig Dispense Refill   Cholecalciferol (VITAMIN D3) 50 MCG (2000 UT) CAPS Take by mouth.     clobetasol (OLUX) 0.05 % topical foam Apply topically 2 (two) times daily. 50 g 2   fluticasone (CUTIVATE) 0.05 % cream Apply topically 2 (two) times daily.     OVER THE COUNTER MEDICATION corticare b cap 2 tablets in the morning and 2 tablets in the eveneing     OVER THE COUNTER MEDICATION compounded medication  PROGESTERONE SR (LACTOSE FREE)  Take one capsule  by mouth daily at bedtime.     rosuvastatin (CRESTOR) 20 MG tablet TAKE 1 TABLET BY MOUTH DAILY 90 tablet 3   tirzepatide (ZEPBOUND) 7.5 MG/0.5ML Pen Inject 7.5 mg into the skin once a week. 2 mL 1   TOPICALS DOCUMENTATION OPTIME COMPOUNDED MEDICATION  BIEST 5/5 (ESTRIOL/ESTRADIOL) AVB ( )  Apply 1 ml to inner arm or thigh and rub in once daily in the morning.     No facility-administered medications prior to visit.    No Known Allergies  Review of Systems  Constitutional:  Negative for fever and malaise/fatigue.  HENT:  Negative for congestion.   Eyes:  Negative for blurred vision.  Respiratory:  Negative for shortness of breath.   Cardiovascular:  Negative for chest pain, palpitations and leg swelling.  Gastrointestinal:  Negative for abdominal pain, blood in stool and nausea.  Genitourinary:  Negative for dysuria and frequency.  Musculoskeletal:  Negative for falls.  Skin:  Negative for rash.  Neurological:   Negative for dizziness, loss of consciousness and headaches.  Endo/Heme/Allergies:  Negative for environmental allergies.  Psychiatric/Behavioral:  Negative for depression. The patient is not nervous/anxious.        Objective:    Physical Exam Constitutional:      General: She is not in acute distress.    Appearance: Normal appearance. She is not ill-appearing or toxic-appearing.  HENT:     Head: Normocephalic and atraumatic.     Right Ear: External ear normal.     Left Ear: External ear normal.     Nose: Nose normal.  Eyes:     General:        Right eye: No discharge.        Left eye: No discharge.  Pulmonary:     Effort: Pulmonary effort is normal.  Skin:    Findings: No rash.  Neurological:     Mental Status: She is alert and oriented to person, place, and time.  Psychiatric:        Behavior: Behavior normal.     Wt 143 lb (64.9 kg)   BMI 23.80 kg/m  Wt Readings from Last 3 Encounters:  08/12/23 143 lb (64.9 kg)  06/23/23 153 lb (69.4 kg)  04/13/23 158 lb (71.7 kg)    Diabetic Foot Exam - Simple   No data filed    Lab Results  Component Value Date   WBC 5.3 03/08/2023   HGB 13.3 03/08/2023   HCT 41.3 03/08/2023   PLT 226.0 03/08/2023   GLUCOSE 83 03/08/2023   CHOL 283 (A) 03/04/2020   TRIG 97 03/04/2020   HDL 85 (A) 03/04/2020   LDLCALC 182 03/04/2020   ALT 17 03/08/2023   AST 18 03/08/2023   NA 139 03/08/2023   K 4.2 03/08/2023   CL 104 03/08/2023   CREATININE 0.80 03/08/2023   BUN 16 03/08/2023   CO2 25 03/08/2023   TSH 2.85 03/08/2023   HGBA1C 5.4 03/04/2020    Lab Results  Component Value Date   TSH 2.85 03/08/2023   Lab Results  Component Value Date   WBC 5.3 03/08/2023   HGB 13.3 03/08/2023   HCT 41.3 03/08/2023   MCV 92.7 03/08/2023   PLT 226.0 03/08/2023   Lab Results  Component Value Date   NA 139 03/08/2023   K 4.2 03/08/2023   CO2 25 03/08/2023   GLUCOSE 83 03/08/2023   BUN 16 03/08/2023   CREATININE 0.80 03/08/2023    BILITOT 0.5 03/08/2023   ALKPHOS 49  03/08/2023   AST 18 03/08/2023   ALT 17 03/08/2023   PROT 6.5 03/08/2023   ALBUMIN 4.5 03/08/2023   CALCIUM 9.6 03/08/2023   GFR 80.85 03/08/2023   Lab Results  Component Value Date   CHOL 283 (A) 03/04/2020   Lab Results  Component Value Date   HDL 85 (A) 03/04/2020   Lab Results  Component Value Date   LDLCALC 182 03/04/2020   Lab Results  Component Value Date   TRIG 97 03/04/2020   No results found for: "CHOLHDL" Lab Results  Component Value Date   HGBA1C 5.4 03/04/2020       Assessment & Plan:  Dyslipidemia Assessment & Plan: Encourage heart healthy diet such as MIND or DASH diet, increase exercise, avoid trans fats, simple carbohydrates and processed foods, consider a krill or fish or flaxseed oil cap daily.  Tolerating Rosuvastatin and follows with cardiology   Vitamin D deficiency Assessment & Plan: Supplement and monitor    Obesity, unspecified class, unspecified obesity type, unspecified whether serious comorbidity present Assessment & Plan: She is working hard to try and keep her weight down but it continues to climb. She walks briskly over a 5K daily, she uses a fit bit to track her movement. She watches her intake and is struggling despite all of her efforts. Will increase Zepbound to 7.5 mg weekly   Insomnia, unspecified type Assessment & Plan: Encouraged good sleep hygiene such as dark, quiet room. No blue/green glowing lights such as computer screens in bedroom. No alcohol or stimulants in evening. Cut down on caffeine as able. Regular exercise is helpful but not just prior to bed time.       Danise Edge, MD

## 2023-10-09 ENCOUNTER — Encounter: Payer: Self-pay | Admitting: Family Medicine

## 2023-10-11 ENCOUNTER — Other Ambulatory Visit: Payer: Self-pay | Admitting: Family

## 2023-10-12 ENCOUNTER — Other Ambulatory Visit: Payer: Self-pay | Admitting: Family Medicine

## 2023-10-12 MED ORDER — TIRZEPATIDE-WEIGHT MANAGEMENT 10 MG/0.5ML ~~LOC~~ SOLN: 10.0000 mg | SUBCUTANEOUS | 1 refills | Status: DC

## 2023-10-13 MED ORDER — ZEPBOUND 10 MG/0.5ML ~~LOC~~ SOAJ
10.0000 mg | SUBCUTANEOUS | 0 refills | Status: DC
Start: 2023-10-13 — End: 2023-11-08

## 2023-10-14 ENCOUNTER — Other Ambulatory Visit (HOSPITAL_COMMUNITY): Payer: Self-pay

## 2023-10-14 ENCOUNTER — Telehealth: Payer: Self-pay

## 2023-10-14 NOTE — Telephone Encounter (Signed)

## 2023-11-08 ENCOUNTER — Other Ambulatory Visit: Payer: Self-pay | Admitting: Family Medicine

## 2023-11-08 MED ORDER — ZEPBOUND 10 MG/0.5ML ~~LOC~~ SOAJ: 10.0000 mg | SUBCUTANEOUS | 2 refills | Status: DC

## 2023-11-08 NOTE — Telephone Encounter (Unsigned)
 Copied from CRM [id number]. Topic: Appointments - Scheduling Inquiry for Clinic >> Nov 08, 2023 12:37 PM Robinson DEL wrote: Reason for CRM: Patient rescheduled appointment with Dr. Domenica for October 6, patient wanted to know if she needs to come in earlier since the appointment in July was to dial back her Zepbound .  [place] [phone]

## 2023-11-09 ENCOUNTER — Other Ambulatory Visit: Payer: Self-pay | Admitting: Family Medicine

## 2023-11-30 ENCOUNTER — Telehealth: Admitting: Family Medicine

## 2024-01-20 LAB — HM MAMMOGRAPHY

## 2024-01-23 ENCOUNTER — Encounter: Payer: Self-pay | Admitting: Family Medicine

## 2024-01-27 ENCOUNTER — Encounter: Payer: Self-pay | Admitting: Family Medicine

## 2024-01-31 ENCOUNTER — Other Ambulatory Visit: Payer: Self-pay | Admitting: Family

## 2024-01-31 ENCOUNTER — Other Ambulatory Visit: Payer: Self-pay | Admitting: Family Medicine

## 2024-01-31 MED ORDER — TIRZEPATIDE-WEIGHT MANAGEMENT 7.5 MG/0.5ML ~~LOC~~ SOAJ: 7.5000 mg | SUBCUTANEOUS | 1 refills | Status: DC

## 2024-01-31 MED ORDER — TIRZEPATIDE 7.5 MG/0.5ML ~~LOC~~ SOAJ: 7.5000 mg | SUBCUTANEOUS | 1 refills | Status: DC

## 2024-02-20 NOTE — Assessment & Plan Note (Signed)
 Encourage heart healthy diet such as MIND or DASH diet, increase exercise, avoid trans fats, simple carbohydrates and processed foods, consider a krill or fish or flaxseed oil cap daily.  Tolerating Rosuvastatin and follows with cardiology

## 2024-02-20 NOTE — Assessment & Plan Note (Addendum)
 She is working hard to try and keep her weight down but it continues to climb. She walks briskly over a 5K daily, she uses a fit bit to track her movement. She watches her intake and is struggling despite all of her efforts.  Zepbound  to 7.5 mg weekly

## 2024-02-20 NOTE — Assessment & Plan Note (Signed)
 Supplement and monitor

## 2024-02-21 ENCOUNTER — Telehealth (INDEPENDENT_AMBULATORY_CARE_PROVIDER_SITE_OTHER): Admitting: Family Medicine

## 2024-02-21 DIAGNOSIS — R634 Abnormal weight loss: Secondary | ICD-10-CM | POA: Diagnosis not present

## 2024-02-21 DIAGNOSIS — E785 Hyperlipidemia, unspecified: Secondary | ICD-10-CM

## 2024-02-21 DIAGNOSIS — E559 Vitamin D deficiency, unspecified: Secondary | ICD-10-CM

## 2024-02-21 DIAGNOSIS — E669 Obesity, unspecified: Secondary | ICD-10-CM

## 2024-02-21 MED ORDER — ZEPBOUND 5 MG/0.5ML ~~LOC~~ SOAJ: 5.0000 mg | SUBCUTANEOUS | 1 refills | Status: DC

## 2024-02-21 NOTE — Patient Instructions (Addendum)
 Shingrix is the new shingles shot, 2 shots over 2-6 months, confirm coverage with insurance and document, then can return here for shots with nurse appt or at pharmacy   Annual flu  RSV, Respiratory Syncitial Virus vaccine, Arexvy vaccine  Prevnar 20 or Pneumococcal 23 once

## 2024-02-22 ENCOUNTER — Encounter: Payer: Self-pay | Admitting: Family Medicine

## 2024-02-22 NOTE — Progress Notes (Signed)
 MyChart Video Visit    Virtual Visit via Video Note   This patient is at least at moderate risk for complications without adequate follow up. This format is felt to be most appropriate for this patient at this time. Physical exam was limited by quality of the video and audio technology used for the visit. Levorn, [name] was able to get the patient set up on a video visit.  Patient location: home Patient and provider in visit Provider location: Office  I discussed the limitations of evaluation and management by telemedicine and the availability of in person appointments. The patient expressed understanding and agreed to proceed.  Visit Date: 02/21/2024  Today's healthcare provider: Harlene Horton, MD     Subjective:    Patient ID: Martha Rhodes, female    DOB: 19-Jun-1965, 58 y.o.   MRN: [id number]  Chief Complaint  Patient presents with   Weight Management Screening    Will like to discuss her medication    HPI Discussed the use of AI scribe software for clinical note transcription with the patient, who gave verbal consent to proceed.  History of Present Illness Martha Rhodes is a 58 year old female who presents for a follow-up regarding her weight management with Zepbound .  She started Zepbound  in August of the previous year and has experienced significant weight loss, currently maintaining between 123 and 126 pounds. She wishes to avoid further weight loss. Initially, she experienced unexplained weight gain despite maintaining her diet and exercise routine, which included brisk walking after she had to stop running due to injuries. She experienced weight gain after menopause and changes in hormone therapy, having switched from a patch to compounded estrogen and progesterone cream. Since starting Zepbound , she has lost almost 60 pounds and notes a change in her eating habits, such as not finishing meals when dining out.  She reports a change in her sense of smell and taste, which  she first noticed in early September. She describes it as a 'really different sort of sense of smell and taste' and wonders if it could be related to a past infection, as her son had COVID around the same time, although she tested negative for COVID herself.  She is currently taking a low-dose statin for cholesterol and vitamin D  supplements. She is also using compounded estrogen and progesterone cream for hormone replacement therapy.    Past Medical History:  Diagnosis Date   Dyslipidemia 10/31/2020   Insomnia 10/31/2020   Psoriasis     Past Surgical History:  Procedure Laterality Date   ADENOIDECTOMY     APPENDECTOMY     WISDOM TOOTH EXTRACTION      Family History  Problem Relation Age of Onset   High blood pressure Mother    Heart disease Mother    Dementia Mother    Dementia Father    Heart disease Father    Stroke Father    High blood pressure Father    Hyperlipidemia Father    Cancer Maternal Aunt        colon cancer   Colon cancer Maternal Aunt        60s   Heart attack Maternal Aunt    Heart disease Paternal Grandmother    Stroke Paternal Grandfather    Heart disease Paternal Grandfather    Esophageal cancer Neg Hx    Rectal cancer Neg Hx    Stomach cancer Neg Hx     Social History   Socioeconomic History  Marital status: Married    Spouse name: Not on file   Number of children: Not on file   Years of education: Not on file   Highest education level: Not on file  Occupational History   Not on file  Tobacco Use   Smoking status: Never   Smokeless tobacco: Never  Substance and Sexual Activity   Alcohol use: Not Currently    Comment: occasional glass of wine 1-2/month   Drug use: No   Sexual activity: Yes    Birth control/protection: Post-menopausal  Other Topics Concern   Not on file  Social History Narrative   Lives with husband and youngest son. No dietary restrictions, wears seat belts and eats a heart healthy diet. Wears her seatbelt in the  car.    Social Drivers of Corporate investment banker Strain: Not on file  Food Insecurity: No Food Insecurity (03/16/2023)   Hunger Vital Sign    Worried About Running Out of Food in the Last Year: Never true    Ran Out of Food in the Last Year: Never true  Transportation Needs: Not on file  Physical Activity: Not on file  Stress: Not on file  Social Connections: Not on file  Intimate Partner Violence: Not on file    Outpatient Medications Prior to Visit  Medication Sig Dispense Refill   Cholecalciferol (VITAMIN D3) 50 MCG (2000 UT) CAPS Take by mouth.     clobetasol  (OLUX ) 0.05 % topical foam Apply topically 2 (two) times daily. 50 g 2   fluticasone (CUTIVATE) 0.05 % cream Apply topically 2 (two) times daily.     OVER THE COUNTER MEDICATION corticare b cap 2 tablets in the morning and 2 tablets in the eveneing     OVER THE COUNTER MEDICATION compounded medication  PROGESTERONE SR (LACTOSE FREE)  Take one capsule by mouth daily at bedtime.     rosuvastatin  (CRESTOR ) 20 MG tablet TAKE 1 TABLET BY MOUTH DAILY 90 tablet 3   TOPICALS DOCUMENTATION OPTIME COMPOUNDED MEDICATION  BIEST 5/5 (ESTRIOL/ESTRADIOL) AVB ( )  Apply 1 ml to inner arm or thigh and rub in once daily in the morning.     tirzepatide  (MOUNJARO ) 7.5 MG/0.5ML Pen Inject 7.5 mg into the skin once a week. 2 mL 1   tirzepatide  (ZEPBOUND ) 7.5 MG/0.5ML Pen Inject 7.5 mg into the skin once a week. 2 mL 1   No facility-administered medications prior to visit.    No Known Allergies  Review of Systems  Constitutional:  Negative for fever and malaise/fatigue.  HENT:  Negative for congestion.   Eyes:  Negative for blurred vision.  Respiratory:  Negative for shortness of breath.   Cardiovascular:  Negative for chest pain, palpitations and leg swelling.  Gastrointestinal:  Negative for abdominal pain, blood in stool and nausea.  Genitourinary:  Negative for dysuria and frequency.  Musculoskeletal:  Positive for joint  pain. Negative for falls.  Skin:  Negative for rash.  Neurological:  Negative for dizziness, loss of consciousness and headaches.  Endo/Heme/Allergies:  Negative for environmental allergies.  Psychiatric/Behavioral:  Negative for depression. The patient is not nervous/anxious.        Objective:    Physical Exam Constitutional:      General: She is not in acute distress.    Appearance: Normal appearance. She is not ill-appearing or toxic-appearing.  HENT:     Head: Normocephalic and atraumatic.     Right Ear: External ear normal.     Left Ear: External ear  normal.     Nose: Nose normal.  Eyes:     General:        Right eye: No discharge.        Left eye: No discharge.  Pulmonary:     Effort: Pulmonary effort is normal.  Skin:    Findings: No rash.  Neurological:     Mental Status: She is alert and oriented to person, place, and time.  Psychiatric:        Behavior: Behavior normal.     There were no vitals taken for this visit. Wt Readings from Last 3 Encounters:  08/12/23 143 lb (64.9 kg)  06/23/23 153 lb (69.4 kg)  04/13/23 158 lb (71.7 kg)       Assessment & Plan:  Vitamin D  deficiency Assessment & Plan: Supplement and monitor   Orders: -     CBC with Differential/Platelet; Future -     TSH; Future -     VITAMIN D  25 Hydroxy (Vit-D Deficiency, Fractures); Future  Obesity, unspecified class, unspecified obesity type, unspecified whether serious comorbidity present Assessment & Plan: She is working hard to try and keep her weight down but it continues to climb. She walks briskly over a 5K daily, she uses a fit bit to track her movement. She watches her intake and is struggling despite all of her efforts.  Zepbound  to 7.5 mg weekly  Orders: -     CBC with Differential/Platelet; Future -     TSH; Future  Dyslipidemia Assessment & Plan: Encourage heart healthy diet such as MIND or DASH diet, increase exercise, avoid trans fats, simple carbohydrates and  processed foods, consider a krill or fish or flaxseed oil cap daily.  Tolerating Rosuvastatin  and follows with cardiology  Orders: -     CBC with Differential/Platelet; Future -     Comprehensive metabolic panel with GFR; Future -     Lipid panel; Future -     TSH; Future  Weight loss -     TSH; Future  Other orders -     Zepbound ; Inject 5 mg into the skin once a week.  Dispense: 2 mL; Refill: 1     Assessment and Plan Assessment & Plan Obesity Significant weight loss of approximately 60 pounds since starting Zepbound  in August last year. Currently maintaining weight between 123 and 126 pounds. Decreased appetite noted after increasing dose to 10 mg. Considering dose reduction to 7.5 mg and eventually to 5 mg for maintenance. Paying out of pocket for medication and considering [employer]'s direct syringe option for cost savings. - Send prescription for Zepbound  5 mg pen with a refill for maintenance. - Monitor weight and adjust dosage as needed, with the option to return to 7.5 mg if necessary. - Consider [employer]'s direct syringe option for cost savings. - Discuss potential changes in pharmaceutical assistance programs with her.  Altered sense of taste and smell Change in taste and smell began around the time she started Zepbound . Suspected to be related to a viral infection, such as COVID-19, rather than the medication itself. - Monitor symptoms and reassess if they persist or worsen.  Hyperlipidemia Currently on low-dose Rosuvastatin  for cholesterol management.  Vitamin D  deficiency Continues to take a vitamin D  supplement, though unsure of the exact dosage. - Order lab work to include vitamin D  levels to assess current status.  General Health Maintenance Up to date with COVID-19 booster. Plans to receive flu shot and shingles vaccine. Considering RSV and Prevnar 20 vaccines, aware of  insurance coverage issues. Informed about shingles vaccine benefits, including decreased risk of  dementia. - Receive flu shot and shingles vaccine at a convenient location, such as a pharmacy or medical center. - Consider RSV and Prevnar 20 vaccines, weighing current insurance coverage and personal health status. - Order lab work to be completed between October 20 and her physical in December, including thyroid, basic blood count, metabolic panel, and cholesterol.  Follow-up Annual physical scheduled in December. Lab work to be completed prior to appointment. - Schedule lab work between October 20 and the physical in December. - Ensure lab results are available for review at the time of the physical.  Recording duration: 23 minutes     I discussed the assessment and treatment plan with the patient. The patient was provided an opportunity to ask questions and all were answered. The patient agreed with the plan and demonstrated an understanding of the instructions.   The patient was advised to call back or seek an in-person evaluation if the symptoms worsen or if the condition fails to improve as anticipated.  Harlene Horton, MD [facility] [phone] (phone) [phone] (fax)  [facility]

## 2024-02-28 ENCOUNTER — Other Ambulatory Visit (HOSPITAL_BASED_OUTPATIENT_CLINIC_OR_DEPARTMENT_OTHER): Payer: Self-pay

## 2024-02-28 MED ORDER — FLUZONE 0.5 ML IM SUSY
0.5000 mL | PREFILLED_SYRINGE | Freq: Once | INTRAMUSCULAR | 0 refills | Status: AC
  Filled 2024-02-28: qty 0.5, 1d supply, fill #0

## 2024-03-06 ENCOUNTER — Ambulatory Visit: Payer: Self-pay | Admitting: Family Medicine

## 2024-03-06 ENCOUNTER — Other Ambulatory Visit

## 2024-03-06 DIAGNOSIS — R634 Abnormal weight loss: Secondary | ICD-10-CM | POA: Diagnosis not present

## 2024-03-06 DIAGNOSIS — E559 Vitamin D deficiency, unspecified: Secondary | ICD-10-CM | POA: Diagnosis not present

## 2024-03-06 DIAGNOSIS — E669 Obesity, unspecified: Secondary | ICD-10-CM | POA: Diagnosis not present

## 2024-03-06 DIAGNOSIS — E785 Hyperlipidemia, unspecified: Secondary | ICD-10-CM | POA: Diagnosis not present

## 2024-03-06 LAB — CBC WITH DIFFERENTIAL/PLATELET
Basophils Absolute: 0 K/uL (ref 0.0–0.1)
Basophils Relative: 0.8 % (ref 0.0–3.0)
Eosinophils Absolute: 0.1 K/uL (ref 0.0–0.7)
Eosinophils Relative: 1.4 % (ref 0.0–5.0)
HCT: 38.5 % (ref 36.0–46.0)
Hemoglobin: 12.9 g/dL (ref 12.0–15.0)
Lymphocytes Relative: 23.2 % (ref 12.0–46.0)
Lymphs Abs: 1 K/uL (ref 0.7–4.0)
MCHC: 33.6 g/dL (ref 30.0–36.0)
MCV: 92.1 fl (ref 78.0–100.0)
Monocytes Absolute: 0.3 K/uL (ref 0.1–1.0)
Monocytes Relative: 5.7 % (ref 3.0–12.0)
Neutro Abs: 3.1 K/uL (ref 1.4–7.7)
Neutrophils Relative %: 68.9 % (ref 43.0–77.0)
Platelets: 218 K/uL (ref 150.0–400.0)
RBC: 4.18 Mil/uL (ref 3.87–5.11)
RDW: 13.1 % (ref 11.5–15.5)
WBC: 4.4 K/uL (ref 4.0–10.5)

## 2024-03-06 LAB — LIPID PANEL
Cholesterol: 174 mg/dL (ref 0–200)
HDL: 78.1 mg/dL (ref >39.00)
LDL Cholesterol: 86 mg/dL (ref 0–99)
NonHDL: 96
Total CHOL/HDL Ratio: 2
Triglycerides: 51 mg/dL (ref 0.0–149.0)
VLDL: 10.2 mg/dL (ref 0.0–40.0)

## 2024-03-06 LAB — COMPREHENSIVE METABOLIC PANEL WITH GFR
ALT: 18 U/L (ref 0–35)
AST: 16 U/L (ref 0–37)
Albumin: 4.7 g/dL (ref 3.5–5.2)
Alkaline Phosphatase: 48 U/L (ref 39–117)
BUN: 18 mg/dL (ref 6–23)
CO2: 26 meq/L (ref 19–32)
Calcium: 9.4 mg/dL (ref 8.4–10.5)
Chloride: 105 meq/L (ref 96–112)
Creatinine, Ser: 0.72 mg/dL (ref 0.40–1.20)
GFR: 91.11 mL/min (ref >60.00)
Glucose, Bld: 73 mg/dL (ref 70–99)
Potassium: 4.3 meq/L (ref 3.5–5.1)
Sodium: 140 meq/L (ref 135–145)
Total Bilirubin: 0.6 mg/dL (ref 0.2–1.2)
Total Protein: 6.7 g/dL (ref 6.0–8.3)

## 2024-03-06 LAB — TSH: TSH: 2.45 u[IU]/mL (ref 0.35–5.50)

## 2024-03-06 LAB — VITAMIN D 25 HYDROXY (VIT D DEFICIENCY, FRACTURES): VITD: 59.38 ng/mL (ref 30.00–100.00)

## 2024-04-23 ENCOUNTER — Encounter: Payer: Self-pay | Admitting: Family Medicine

## 2024-04-24 ENCOUNTER — Other Ambulatory Visit: Payer: Self-pay | Admitting: Family

## 2024-04-24 MED ORDER — ZEPBOUND 5 MG/0.5ML ~~LOC~~ SOAJ: 5.0000 mg | SUBCUTANEOUS | 1 refills | Status: AC

## 2024-04-30 NOTE — Assessment & Plan Note (Signed)
 Encourage heart healthy diet such as MIND or DASH diet, increase exercise, avoid trans fats, simple carbohydrates and processed foods, consider a krill or fish or flaxseed oil cap daily.  Tolerating Rosuvastatin and follows with cardiology

## 2024-04-30 NOTE — Progress Notes (Unsigned)
 Subjective:    Patient ID: Powell CHRISTELLA Litter, female    DOB: 1965/08/27, 58 y.o.   MRN: [id number]  No chief complaint on file.   HPI Discussed the use of AI scribe software for clinical note transcription with the patient, who gave verbal consent to proceed.  History of Present Illness     Past Medical History:  Diagnosis Date   Dyslipidemia 10/31/2020   Insomnia 10/31/2020   Psoriasis     Past Surgical History:  Procedure Laterality Date   ADENOIDECTOMY     APPENDECTOMY     WISDOM TOOTH EXTRACTION      Family History  Problem Relation Age of Onset   High blood pressure Mother    Heart disease Mother    Dementia Mother    Dementia Father    Heart disease Father    Stroke Father    High blood pressure Father    Hyperlipidemia Father    Cancer Maternal Aunt        colon cancer   Colon cancer Maternal Aunt        60s   Heart attack Maternal Aunt    Heart disease Paternal Grandmother    Stroke Paternal Grandfather    Heart disease Paternal Grandfather    Esophageal cancer Neg Hx    Rectal cancer Neg Hx    Stomach cancer Neg Hx     Social History   Socioeconomic History   Marital status: Married    Spouse name: Not on file   Number of children: Not on file   Years of education: Not on file   Highest education level: Doctorate  Occupational History   Not on file  Tobacco Use   Smoking status: Never   Smokeless tobacco: Never  Substance and Sexual Activity   Alcohol use: Not Currently    Comment: occasional glass of wine 1-2/month   Drug use: No   Sexual activity: Yes    Birth control/protection: Post-menopausal  Other Topics Concern   Not on file  Social History Narrative   Lives with husband and youngest son. No dietary restrictions, wears seat belts and eats a heart healthy diet. Wears her seatbelt in the car.    Social Drivers of Health   Tobacco Use: Low Risk (06/23/2023)   Patient History    Smoking  Tobacco Use: Never    Smokeless Tobacco Use: Never    Passive Exposure: Not on file  Financial Resource Strain: Low Risk (04/30/2024)   Overall Financial Resource Strain (CARDIA)    Difficulty of Paying Living Expenses: Not hard at all  Food Insecurity: No Food Insecurity (04/30/2024)   Epic    Worried About Programme Researcher, Broadcasting/film/video in the Last Year: Never true    Ran Out of Food in the Last Year: Never true  Transportation Needs: No Transportation Needs (04/30/2024)   Epic    Lack of Transportation (Medical): No    Lack of Transportation (Non-Medical): No  Physical Activity: Sufficiently Active (04/30/2024)   Exercise Vital Sign    Days of Exercise per Week: 6 days    Minutes of Exercise per Session: 70 min  Stress: No Stress Concern Present (04/30/2024)   [employer] of Occupational Health - Occupational Stress Questionnaire    Feeling of Stress: Only a little  Social Connections: Moderately Integrated (04/30/2024)   Social Connection and Isolation Panel    Frequency of Communication with Friends and Family: More than three times a week    Frequency  of Social Gatherings with Friends and Family: Once a week    Attends Religious Services: 1 to 4 times per year    Active Member of [employer] or Organizations: No    Attends Engineer, Structural: Not on file    Marital Status: Married  Catering Manager Violence: Not on file  Depression (PHQ2-9): Low Risk (03/16/2023)   Depression (PHQ2-9)    PHQ-2 Score: 0  Alcohol Screen: Not on file  Housing: Unknown (04/30/2024)   Epic    Unable to Pay for Housing in the Last Year: No    Number of Times Moved in the Last Year: Not on file    Homeless in the Last Year: No  Utilities: Not on file  Health Literacy: Not on file    Outpatient Medications Prior to Visit  Medication Sig Dispense Refill   Cholecalciferol (VITAMIN D3) 50 MCG (2000 UT) CAPS Take by mouth.     clobetasol  (OLUX ) 0.05 % topical foam Apply  topically 2 (two) times daily. 50 g 2   fluticasone (CUTIVATE) 0.05 % cream Apply topically 2 (two) times daily.     OVER THE COUNTER MEDICATION corticare b cap 2 tablets in the morning and 2 tablets in the eveneing     OVER THE COUNTER MEDICATION compounded medication  PROGESTERONE SR (LACTOSE FREE)  Take one capsule by mouth daily at bedtime.     rosuvastatin  (CRESTOR ) 20 MG tablet TAKE 1 TABLET BY MOUTH DAILY 90 tablet 3   tirzepatide  (ZEPBOUND ) 5 MG/0.5ML Pen Inject 5 mg into the skin once a week. 2 mL 1   TOPICALS DOCUMENTATION OPTIME COMPOUNDED MEDICATION  BIEST 5/5 (ESTRIOL/ESTRADIOL) AVB ( )  Apply 1 ml to inner arm or thigh and rub in once daily in the morning.     No facility-administered medications prior to visit.    Allergies[1]  Review of Systems  Constitutional:  Negative for chills, fever and malaise/fatigue.  HENT:  Negative for congestion and hearing loss.   Eyes:  Negative for discharge.  Respiratory:  Negative for cough, sputum production and shortness of breath.   Cardiovascular:  Negative for chest pain, palpitations and leg swelling.  Gastrointestinal:  Negative for abdominal pain, blood in stool, constipation, diarrhea, heartburn, nausea and vomiting.  Genitourinary:  Negative for dysuria, frequency, hematuria and urgency.  Musculoskeletal:  Negative for back pain, falls and myalgias.  Skin:  Negative for rash.  Neurological:  Negative for dizziness, sensory change, loss of consciousness, weakness and headaches.  Endo/Heme/Allergies:  Negative for environmental allergies. Does not bruise/bleed easily.  Psychiatric/Behavioral:  Negative for depression and suicidal ideas. The patient is not nervous/anxious and does not have insomnia.        Objective:    Physical Exam Constitutional:      General: She is not in acute distress.    Appearance: Normal appearance. She is not diaphoretic.  HENT:     Head: Normocephalic and atraumatic.     Right Ear:  Tympanic membrane, ear canal and external ear normal.     Left Ear: Tympanic membrane, ear canal and external ear normal.     Nose: Nose normal.     Mouth/Throat:     Mouth: Mucous membranes are moist.     Pharynx: Oropharynx is clear. No oropharyngeal exudate.  Eyes:     General: No scleral icterus.       Right eye: No discharge.        Left eye: No discharge.     Conjunctiva/sclera:  Conjunctivae normal.     Pupils: Pupils are equal, round, and reactive to light.  Neck:     Thyroid: No thyromegaly.  Cardiovascular:     Rate and Rhythm: Normal rate and regular rhythm.     Heart sounds: Normal heart sounds. No murmur heard. Pulmonary:     Effort: Pulmonary effort is normal. No respiratory distress.     Breath sounds: Normal breath sounds. No wheezing or rales.  Abdominal:     General: Bowel sounds are normal. There is no distension.     Palpations: Abdomen is soft. There is no mass.     Tenderness: There is no abdominal tenderness.  Musculoskeletal:        General: No tenderness. Normal range of motion.     Cervical back: Normal range of motion and neck supple.  Lymphadenopathy:     Cervical: No cervical adenopathy.  Skin:    General: Skin is warm and dry.     Findings: No rash.  Neurological:     General: No focal deficit present.     Mental Status: She is alert and oriented to person, place, and time.     Cranial Nerves: No cranial nerve deficit.     Coordination: Coordination normal.     Deep Tendon Reflexes: Reflexes are normal and symmetric. Reflexes normal.  Psychiatric:        Mood and Affect: Mood normal.        Behavior: Behavior normal.        Thought Content: Thought content normal.        Judgment: Judgment normal.    There were no vitals taken for this visit. Wt Readings from Last 3 Encounters:  08/12/23 143 lb (64.9 kg)  06/23/23 153 lb (69.4 kg)  04/13/23 158 lb (71.7 kg)    Diabetic Foot Exam - Simple   No data filed    Lab Results  Component  Value Date   WBC 4.4 03/06/2024   HGB 12.9 03/06/2024   HCT 38.5 03/06/2024   PLT 218.0 03/06/2024   GLUCOSE 73 03/06/2024   CHOL 174 03/06/2024   TRIG 51.0 03/06/2024   HDL 78.10 03/06/2024   LDLCALC 86 03/06/2024   ALT 18 03/06/2024   AST 16 03/06/2024   NA 140 03/06/2024   K 4.3 03/06/2024   CL 105 03/06/2024   CREATININE 0.72 03/06/2024   BUN 18 03/06/2024   CO2 26 03/06/2024   TSH 2.45 03/06/2024   HGBA1C 5.4 03/04/2020    Lab Results  Component Value Date   TSH 2.45 03/06/2024   Lab Results  Component Value Date   WBC 4.4 03/06/2024   HGB 12.9 03/06/2024   HCT 38.5 03/06/2024   MCV 92.1 03/06/2024   PLT 218.0 03/06/2024   Lab Results  Component Value Date   NA 140 03/06/2024   K 4.3 03/06/2024   CO2 26 03/06/2024   GLUCOSE 73 03/06/2024   BUN 18 03/06/2024   CREATININE 0.72 03/06/2024   BILITOT 0.6 03/06/2024   ALKPHOS 48 03/06/2024   AST 16 03/06/2024   ALT 18 03/06/2024   PROT 6.7 03/06/2024   ALBUMIN 4.7 03/06/2024   CALCIUM  9.4 03/06/2024   GFR 91.11 03/06/2024   Lab Results  Component Value Date   CHOL 174 03/06/2024   Lab Results  Component Value Date   HDL 78.10 03/06/2024   Lab Results  Component Value Date   LDLCALC 86 03/06/2024   Lab Results  Component Value Date  TRIG 51.0 03/06/2024   Lab Results  Component Value Date   CHOLHDL 2 03/06/2024   Lab Results  Component Value Date   HGBA1C 5.4 03/04/2020       Assessment & Plan:  Vitamin D  deficiency Assessment & Plan: Supplement and monitor    Preventative health care Assessment & Plan: Patient encouraged to maintain heart healthy diet, regular exercise, adequate sleep. Consider daily probiotics. Take medications as prescribed  Labs ordered and reviewed  MGM 2024,repeat every 1-2 years Colonoscopy 2017 repeat in 2027 Pap follows with GYN, requested records Dexa due Patient declines vaccines, could consider flu, Tdap, covid, Shingrix  is the new shingles shot, 2  shots over 2-6 months, confirm coverage with insurance and document, then can return here for shots with nurse appt or at pharmacy    Dyslipidemia Assessment & Plan: Encourage heart healthy diet such as MIND or DASH diet, increase exercise, avoid trans fats, simple carbohydrates and processed foods, consider a krill or fish or flaxseed oil cap daily.  Tolerating Rosuvastatin  and follows with cardiology     Assessment and Plan Assessment & Plan      Harlene Horton, MD     [1] No Known Allergies

## 2024-04-30 NOTE — Assessment & Plan Note (Signed)
 Patient encouraged to maintain heart healthy diet, regular exercise, adequate sleep. Consider daily probiotics. Take medications as prescribed  Labs ordered and reviewed  MGM 2024,repeat every 1-2 years Colonoscopy 2017 repeat in 2027 Pap follows with GYN, requested records Dexa due Patient declines vaccines, could consider flu, Tdap, covid, Shingrix  is the new shingles shot, 2 shots over 2-6 months, confirm coverage with insurance and document, then can return here for shots with nurse appt or at pharmacy

## 2024-04-30 NOTE — Assessment & Plan Note (Signed)
 Supplement and monitor

## 2024-05-01 ENCOUNTER — Encounter: Payer: Self-pay | Admitting: Family Medicine

## 2024-05-01 ENCOUNTER — Other Ambulatory Visit (HOSPITAL_BASED_OUTPATIENT_CLINIC_OR_DEPARTMENT_OTHER): Payer: Self-pay

## 2024-05-01 ENCOUNTER — Ambulatory Visit: Payer: BC Managed Care – PPO | Admitting: Family Medicine

## 2024-05-01 VITALS — BP 116/70 | HR 76 | Temp 97.9°F | Resp 16 | Ht 65.0 in | Wt 129.2 lb

## 2024-05-01 DIAGNOSIS — E559 Vitamin D deficiency, unspecified: Secondary | ICD-10-CM

## 2024-05-01 DIAGNOSIS — Z0000 Encounter for general adult medical examination without abnormal findings: Secondary | ICD-10-CM

## 2024-05-01 DIAGNOSIS — E785 Hyperlipidemia, unspecified: Secondary | ICD-10-CM

## 2024-05-01 MED ORDER — SHINGRIX 50 MCG/0.5ML IM SUSR
0.5000 mL | Freq: Once | INTRAMUSCULAR | 0 refills | Status: AC
  Filled 2024-05-01: qty 0.5, 1d supply, fill #0

## 2024-05-01 NOTE — Patient Instructions (Addendum)
 Shingrix is the new shingles shot, 2 shots over 2-6 months, confirm coverage with insurance and document, then can return here for shots with nurse appt or at pharmacy   Preventive Care 8-60 Years Old, Female Preventive care refers to lifestyle choices and visits with your health care provider that can promote health and wellness. Preventive care visits are also called wellness exams. What can I expect for my preventive care visit? Counseling Your health care provider may ask you questions about your: Medical history, including: Past medical problems. Family medical history. Pregnancy history. Current health, including: Menstrual cycle. Method of birth control. Emotional well-being. Home life and relationship well-being. Sexual activity and sexual health. Lifestyle, including: Alcohol, nicotine or tobacco, and drug use. Access to firearms. Diet, exercise, and sleep habits. Work and work Astron[employer]. Sunscreen use. Safety issues such as seatbelt and bike helmet use. Physical exam Your health care provider will check your: Height and weight. These may be used to calculate your BMI (body mass index). BMI is a measurement that tells if you are at a healthy weight. Waist circumference. This measures the distance around your waistline. This measurement also tells if you are at a healthy weight and may help predict your risk of certain diseases, such as type 2 diabetes and high blood pressure. Heart rate and blood pressure. Body temperature. Skin for abnormal spots. What immunizations do I need?  Vaccines are usually given at various ages, according to a schedule. Your health care provider will recommend vaccines for you based on your age, medical history, and lifestyle or other factors, such as travel or where you work. What tests do I need? Screening Your health care provider may recommend screening tests for certain conditions. This may include: Lipid and cholesterol  levels. Diabetes screening. This is done by checking your blood sugar (glucose) after you have not eaten for a while (fasting). Pelvic exam and Pap test. Hepatitis B test. Hepatitis C test. HIV (human immunodeficiency virus) test. STI (sexually transmitted infection) testing, if you are at risk. Lung cancer screening. Colorectal cancer screening. Mammogram. Talk with your health care provider about when you should start having regular mammograms. This may depend on whether you have a family history of breast cancer. BRCA-related cancer screening. This may be done if you have a family history of breast, ovarian, tubal, or peritoneal cancers. Bone density scan. This is done to screen for osteoporosis. Talk with your health care provider about your test results, treatment options, and if necessary, the need for more tests. Follow these instructions at home: Eating and drinking  Eat a diet that includes fresh fruits and vegetables, whole grains, lean protein, and low-fat dairy products. Take vitamin and mineral supplements as recommended by your health care provider. Do not drink alcohol if: Your health care provider tells you not to drink. You are pregnant, may be pregnant, or are planning to become pregnant. If you drink alcohol: Limit how much you have to 0-1 drink a day. Know how much alcohol is in your drink. In the [place] one drink equals one 12 oz bottle of beer (355 mL), one 5 oz glass of wine (148 mL), or one 1 oz glass of hard liquor (44 mL). Lifestyle Brush your teeth every morning and night with fluoride toothpaste. Floss one time each day. Exercise for at least 30 minutes 5 or more days each week. Do not use any products that contain nicotine or tobacco. These products include cigarettes, chewing tobacco, and vaping devices, such as e-cigarettes.  If you need help quitting, ask your health care provider. Do not use drugs. If you are sexually active, practice safe sex. Use a  condom or other form of protection to prevent STIs. If you do not wish to become pregnant, use a form of birth control. If you plan to become pregnant, see your health care provider for a prepregnancy visit. Take aspirin only as told by your health care provider. Make sure that you understand how much to take and what form to take. Work with your health care provider to find out whether it is safe and beneficial for you to take aspirin daily. Find healthy ways to manage stress, such as: Meditation, yoga, or listening to music. Journaling. Talking to a trusted person. Spending time with friends and family. Minimize exposure to UV radiation to reduce your risk of skin cancer. Safety Always wear your seat belt while driving or riding in a vehicle. Do not drive: If you have been drinking alcohol. Do not ride with someone who has been drinking. When you are tired or distracted. While texting. If you have been using any mind-altering substances or drugs. Wear a helmet and other protective equipment during sports activities. If you have firearms in your house, make sure you follow all gun safety procedures. Seek help if you have been physically or sexually abused. What's next? Visit your health care provider once a year for an annual wellness visit. Ask your health care provider how often you should have your eyes and teeth checked. Stay up to date on all vaccines. This information is not intended to replace advice given to you by your health care provider. Make sure you discuss any questions you have with your health care provider. Document Revised: 10/30/2020 Document Reviewed: 10/30/2020 [employer] Patient Education  2024 [employer].

## 2024-10-30 ENCOUNTER — Ambulatory Visit: Admitting: Family Medicine

## 2025-05-03 ENCOUNTER — Encounter: Admitting: Family Medicine
